# Patient Record
Sex: Female | Born: 1990 | Race: Black or African American | Hispanic: No | Marital: Married | Smoking: Never smoker
Health system: Southern US, Community
[De-identification: ages and names within clinical notes are randomized; demographics above are authoritative.]

## PROBLEM LIST (undated history)

## (undated) ENCOUNTER — Inpatient Hospital Stay (HOSPITAL_COMMUNITY): Payer: Self-pay

## (undated) DIAGNOSIS — Z789 Other specified health status: Secondary | ICD-10-CM

## (undated) HISTORY — PX: NO PAST SURGERIES: SHX2092

---

## 2015-11-01 NOTE — L&D Delivery Note (Signed)
Obstetrical Delivery Note   Date of Delivery:   08/10/2016 Primary OB:   [facility] Gestational Age/EDD: 1017w3d  Antepartum complications:  none  Delivered By:   Cornelia Copaharlie Donice Alperin, Jr MD  Delivery Type:   vacuum, low  Delivery Details:   CTSP by CNM due to recurrent deep variables and late decelerations with pushing. Fetus DOA and +3 with good descent with pushing. EFW 3000gm, pelvis felt adequate and given need to expedite delivery, pt was verbally consented for vacuum delivery, which she was amenable to, after d/w her re: r/b/a.  NICU team present. Kiwi vacuum applied in the usual fashion and patient pushed fetus to +3 to 4 with next UC and pop off x 1 at the end. On next UC and pull, fetus out and delivered easily in the standard fashion. Delayed cord clamping done and then handed to awaiting NICU team Anesthesia:    epidural Intrapartum complications: Chorio (patient received amp and gent x 1 prior to delivery) GBS:    Negative Laceration:    1st degree perineal (repaired) and right periuretheral repaired in the usual fashion with 2 and 3-0 plain gut, respectively Episiotomy:    none Placenta:    Delivered and expressed via active management. Intact: yes. To pathology: yes.  Delayed Cord Clamping: yes Estimated Blood Loss:  * No surgery found *  Baby:    Liveborn female, APGARs 8/9, weight 2950gm  Cornelia Copaharlie Marguarite Markov, Jr. MD Attending Center [facility](Faculty Practice)

## 2015-12-07 ENCOUNTER — Encounter: Payer: Self-pay | Admitting: General Practice

## 2015-12-07 ENCOUNTER — Ambulatory Visit (INDEPENDENT_AMBULATORY_CARE_PROVIDER_SITE_OTHER): Payer: 59 | Admitting: General Practice

## 2015-12-07 ENCOUNTER — Encounter: Payer: Self-pay | Admitting: Family Medicine

## 2015-12-07 DIAGNOSIS — Z3201 Encounter for pregnancy test, result positive: Secondary | ICD-10-CM | POA: Diagnosis not present

## 2015-12-07 LAB — POCT PREGNANCY, URINE: PREG TEST UR: POSITIVE — AB

## 2015-12-07 NOTE — Progress Notes (Signed)
Patient here today for upt. Upt +. Reports first positive home test yesterday 2/5. LMP 10/26/15. EDD 08/01/16.6 weeks today. Patient informed. New OB appt tomorrow 2/7 at [facility]. Letter given for preg verification & appt letter for job. Patient had no questions

## 2015-12-08 ENCOUNTER — Encounter: Payer: Self-pay | Admitting: Obstetrics

## 2015-12-08 ENCOUNTER — Ambulatory Visit (INDEPENDENT_AMBULATORY_CARE_PROVIDER_SITE_OTHER): Payer: 59 | Admitting: Obstetrics

## 2015-12-08 VITALS — BP 115/80 | HR 92 | Temp 98.3°F | Ht 65.5 in | Wt 124.0 lb

## 2015-12-08 DIAGNOSIS — N926 Irregular menstruation, unspecified: Secondary | ICD-10-CM | POA: Diagnosis not present

## 2015-12-08 DIAGNOSIS — Z3201 Encounter for pregnancy test, result positive: Secondary | ICD-10-CM | POA: Diagnosis not present

## 2015-12-08 DIAGNOSIS — Z3491 Encounter for supervision of normal pregnancy, unspecified, first trimester: Secondary | ICD-10-CM

## 2015-12-08 LAB — POCT URINE PREGNANCY: Preg Test, Ur: POSITIVE — AB

## 2015-12-08 MED ORDER — VITAFOL GUMMIES 3.33-0.333-34.8 MG PO CHEW: 1.0000 | CHEWABLE_TABLET | Freq: Every day | ORAL | Status: DC

## 2015-12-08 NOTE — Progress Notes (Signed)
Patient ID: Samantha Liu, female   DOB: 02-22-1991, 25 y.o.   MRN: [id number]  Chief Complaint  Patient presents with  . Amenorrhea    HPI Samantha Liu is a 25 y.o. female.  LMP 10-24-16.  Positive subjective signs of pregnancy.  Denies vaginal bleeding or significant cramping. HPI  History reviewed. No pertinent past medical history.  History reviewed. No pertinent past surgical history.  History reviewed. No pertinent family history.  Social History Social History  Substance Use Topics  . Smoking status: Never Smoker   . Smokeless tobacco: None  . Alcohol Use: No    No Known Allergies  Current Outpatient Prescriptions  Medication Sig Dispense Refill  . Prenatal Vit-Fe Phos-FA-Omega (VITAFOL GUMMIES) 3.33-0.333-34.8 MG CHEW Chew 1 tablet by mouth daily before breakfast. 90 tablet 3   No current facility-administered medications for this visit.    Review of Systems Review of Systems Constitutional: negative for fatigue and weight loss Respiratory: negative for cough and wheezing Cardiovascular: negative for chest pain, fatigue and palpitations Gastrointestinal: negative for abdominal pain and change in bowel habits Genitourinary: missed period.  Mild cramping, occasionally. Integument/breast: negative for nipple discharge Musculoskeletal:negative for myalgias Neurological: negative for gait problems and tremors Behavioral/Psych: negative for abusive relationship, depression Endocrine: negative for temperature intolerance     Blood pressure 115/80, pulse 92, temperature 98.3 F (36.8 C), height 5' 5.5" (1.664 m), weight 124 lb (56.246 kg), last menstrual period 10/25/2015.  Physical Exam Physical Exam General:   alert  Skin:   no rash or abnormalities  Lungs:   clear to auscultation bilaterally  Heart:   regular rate and rhythm, S1, S2 normal, no murmur, click, rub or gallop  Breasts:   normal without suspicious masses, skin or nipple changes or axillary nodes   Abdomen:  normal findings: no organomegaly, soft, non-tender and no hernia  Pelvis:  External genitalia: normal general appearance Urinary system: urethral meatus normal and bladder without fullness, nontender Vaginal: normal without tenderness, induration or masses Cervix: normal appearance Adnexa: normal bimanual exam Uterus: anteverted and non-tender, normal size      Data Reviewed UPT  Assessment     Early 1st trimester pregnancy.  Stable.     Plan    Wet prep and cultures done. PNV's Rx F/U in 4 weeks.  Orders Placed This Encounter  Procedures  . SureSwab, Vaginosis/Vaginitis Plus  . POCT urine pregnancy   Meds ordered this encounter  Medications  . Prenatal Vit-Fe Phos-FA-Omega (VITAFOL GUMMIES) 3.33-0.333-34.8 MG CHEW    Sig: Chew 1 tablet by mouth daily before breakfast.    Dispense:  90 tablet    Refill:  3

## 2015-12-11 ENCOUNTER — Other Ambulatory Visit: Payer: Self-pay | Admitting: Obstetrics

## 2015-12-11 DIAGNOSIS — N760 Acute vaginitis: Principal | ICD-10-CM

## 2015-12-11 DIAGNOSIS — B9689 Other specified bacterial agents as the cause of diseases classified elsewhere: Secondary | ICD-10-CM

## 2015-12-11 LAB — SURESWAB, VAGINOSIS/VAGINITIS PLUS
ATOPOBIUM VAGINAE: 5.9 Log (cells/mL)
C. PARAPSILOSIS, DNA: NOT DETECTED
C. TROPICALIS, DNA: NOT DETECTED
C. albicans, DNA: NOT DETECTED
C. glabrata, DNA: NOT DETECTED
C. trachomatis RNA, TMA: NOT DETECTED
Gardnerella vaginalis: 8 Log (cells/mL)
LACTOBACILLUS SPECIES: NOT DETECTED Log (cells/mL)
MEGASPHAERA SPECIES: 7.9 Log (cells/mL)
N. GONORRHOEAE RNA, TMA: NOT DETECTED
T. vaginalis RNA, QL TMA: NOT DETECTED

## 2015-12-11 MED ORDER — TINIDAZOLE 500 MG PO TABS: 1000.0000 mg | ORAL_TABLET | Freq: Every day | ORAL | Status: DC

## 2015-12-15 ENCOUNTER — Telehealth: Payer: Self-pay | Admitting: *Deleted

## 2015-12-15 NOTE — Telephone Encounter (Signed)
Pt called to office for return call.   Call placed to pt husband as number in chart was incorrect.  LM that office is returning her call.  Will try to call again tomorrow.

## 2015-12-16 ENCOUNTER — Telehealth: Payer: Self-pay | Admitting: *Deleted

## 2015-12-16 NOTE — Telephone Encounter (Signed)
Pt had placed call to office. Attempt to return call to pt.  No answer, LM on VM to call office.

## 2016-01-05 ENCOUNTER — Encounter: Payer: Self-pay | Admitting: Obstetrics

## 2016-01-05 ENCOUNTER — Ambulatory Visit (INDEPENDENT_AMBULATORY_CARE_PROVIDER_SITE_OTHER): Payer: 59 | Admitting: Obstetrics

## 2016-01-05 VITALS — BP 112/72 | HR 87 | Temp 98.3°F | Wt 126.0 lb

## 2016-01-05 DIAGNOSIS — Z3401 Encounter for supervision of normal first pregnancy, first trimester: Secondary | ICD-10-CM

## 2016-01-05 NOTE — Progress Notes (Signed)
Subjective:    Samantha Liu is being seen today for her first obstetrical visit.  This is not a planned pregnancy. She is at 3444w2d gestation. Her obstetrical history is significant for none. Relationship with FOB: significant other, living together. Patient does intend to breast feed. Pregnancy history fully reviewed.  The information documented in the HPI was reviewed and verified.  Menstrual History: OB History    Gravida Para Term Preterm AB TAB SAB Ectopic Multiple Living   1                Patient's last menstrual period was 10/25/2015.    History reviewed. No pertinent past medical history.  History reviewed. No pertinent past surgical history.   (Not in a hospital admission) No Known Allergies  Social History  Substance Use Topics  . Smoking status: Never Smoker   . Smokeless tobacco: Never Used  . Alcohol Use: No    History reviewed. No pertinent family history.   Review of Systems Constitutional: negative for weight loss Gastrointestinal: negative for vomiting Genitourinary:negative for genital lesions and vaginal discharge and dysuria Musculoskeletal:negative for back pain Behavioral/Psych: negative for abusive relationship, depression, illegal drug usage and tobacco use    Objective:    BP 112/72 mmHg  Pulse 87  Temp(Src) 98.3 F (36.8 C)  Wt 126 lb (57.153 kg)  LMP 10/25/2015 General Appearance:    Alert, cooperative, no distress, appears stated age  Head:    Normocephalic, without obvious abnormality, atraumatic  Eyes:    PERRL, conjunctiva/corneas clear, EOM's intact, fundi    benign, both eyes  Ears:    Normal TM's and external ear canals, both ears  Nose:   Nares normal, septum midline, mucosa normal, no drainage    or sinus tenderness  Throat:   Lips, mucosa, and tongue normal; teeth and gums normal  Neck:   Supple, symmetrical, trachea midline, no adenopathy;    thyroid:  no enlargement/tenderness/nodules; no carotid   bruit or JVD  Back:      Symmetric, no curvature, ROM normal, no CVA tenderness  Lungs:     Clear to auscultation bilaterally, respirations unlabored  Chest Wall:    No tenderness or deformity   Heart:    Regular rate and rhythm, S1 and S2 normal, no murmur, rub   or gallop  Breast Exam:    No tenderness, masses, or nipple abnormality  Abdomen:     Soft, non-tender, bowel sounds active all four quadrants,    no masses, no organomegaly  Genitalia:    Normal female without lesion, discharge or tenderness  Extremities:   Extremities normal, atraumatic, no cyanosis or edema  Pulses:   2+ and symmetric all extremities  Skin:   Skin color, texture, turgor normal, no rashes or lesions  Lymph nodes:   Cervical, supraclavicular, and axillary nodes normal  Neurologic:   CNII-XII intact, normal strength, sensation and reflexes    throughout      Lab Review Urine pregnancy test Labs reviewed yes Radiologic studies reviewed no Assessment:    Pregnancy at 4244w2d weeks    Plan:      Prenatal vitamins.  Counseling provided regarding continued use of seat belts, cessation of alcohol consumption, smoking or use of illicit drugs; infection precautions i.e., influenza/TDAP immunizations, toxoplasmosis,CMV, parvovirus, listeria and varicella; workplace safety, exercise during pregnancy; routine dental care, safe medications, sexual activity, hot tubs, saunas, pools, travel, caffeine use, fish and methlymercury, potential toxins, hair treatments, varicose veins Weight gain recommendations per IOM  guidelines reviewed: underweight/BMI< 18.5--> gain 28 - 40 lbs; normal weight/BMI 18.5 - 24.9--> gain 25 - 35 lbs; overweight/BMI 25 - 29.9--> gain 15 - 25 lbs; obese/BMI >30->gain  11 - 20 lbs Problem list reviewed and updated. FIRST/CF mutation testing/NIPT/QUAD SCREEN/fragile X/Ashkenazi Jewish population testing/Spinal muscular atrophy discussed: requested. Role of ultrasound in pregnancy discussed; fetal survey:  requested. Amniocentesis discussed: not indicated. VBAC calculator score: VBAC consent form provided No orders of the defined types were placed in this encounter.   Orders Placed This Encounter  Procedures  . Culture, OB Urine  . Obstetric panel  . HIV antibody  . Hemoglobinopathy evaluation  . Varicella zoster antibody, IgG  . VITAMIN D 25 Hydroxy (Vit-D Deficiency, Fractures)  . POCT urinalysis dipstick    Follow up in 4 weeks.

## 2016-01-06 LAB — OBSTETRIC PANEL
Antibody Screen: NEGATIVE
BASOS ABS: 0 10*3/uL (ref 0.0–0.1)
BASOS PCT: 0 % (ref 0–1)
Eosinophils Absolute: 0.1 10*3/uL (ref 0.0–0.7)
Eosinophils Relative: 1 % (ref 0–5)
HEMATOCRIT: 39.4 % (ref 36.0–46.0)
HEP B S AG: NEGATIVE
Hemoglobin: 13 g/dL (ref 12.0–15.0)
Lymphocytes Relative: 17 % (ref 12–46)
Lymphs Abs: 1.5 10*3/uL (ref 0.7–4.0)
MCH: 30 pg (ref 26.0–34.0)
MCHC: 33 g/dL (ref 30.0–36.0)
MCV: 91 fL (ref 78.0–100.0)
MONOS PCT: 10 % (ref 3–12)
MPV: 10.6 fL (ref 8.6–12.4)
Monocytes Absolute: 0.9 10*3/uL (ref 0.1–1.0)
NEUTROS ABS: 6.6 10*3/uL (ref 1.7–7.7)
NEUTROS PCT: 72 % (ref 43–77)
Platelets: 286 10*3/uL (ref 150–400)
RBC: 4.33 MIL/uL (ref 3.87–5.11)
RDW: 13.4 % (ref 11.5–15.5)
Rh Type: POSITIVE
Rubella: 2.5 Index — ABNORMAL HIGH (ref <0.90)
WBC: 9.1 10*3/uL (ref 4.0–10.5)

## 2016-01-06 LAB — VITAMIN D 25 HYDROXY (VIT D DEFICIENCY, FRACTURES): Vit D, 25-Hydroxy: 31 ng/mL (ref 30–100)

## 2016-01-06 LAB — HIV ANTIBODY (ROUTINE TESTING W REFLEX): HIV 1&2 Ab, 4th Generation: NONREACTIVE

## 2016-01-06 LAB — VARICELLA ZOSTER ANTIBODY, IGG: VARICELLA IGG: 291 {index} — AB (ref <135.00)

## 2016-01-07 LAB — HEMOGLOBINOPATHY EVALUATION
HEMOGLOBIN OTHER: 0 %
Hgb A2 Quant: 2.7 % (ref 2.2–3.2)
Hgb A: 96.7 % — ABNORMAL LOW (ref 96.8–97.8)
Hgb F Quant: 0.6 % (ref 0.0–2.0)
Hgb S Quant: 0 %

## 2016-01-08 LAB — CULTURE, OB URINE: Colony Count: 75000

## 2016-02-01 ENCOUNTER — Ambulatory Visit (INDEPENDENT_AMBULATORY_CARE_PROVIDER_SITE_OTHER): Payer: 59 | Admitting: Obstetrics

## 2016-02-01 ENCOUNTER — Encounter: Payer: Self-pay | Admitting: Obstetrics

## 2016-02-01 VITALS — BP 114/73 | HR 82 | Temp 98.9°F | Wt 132.0 lb

## 2016-02-01 DIAGNOSIS — Z3492 Encounter for supervision of normal pregnancy, unspecified, second trimester: Secondary | ICD-10-CM

## 2016-02-01 LAB — POCT URINALYSIS DIPSTICK
BILIRUBIN UA: NEGATIVE
Blood, UA: NEGATIVE
Glucose, UA: NEGATIVE
Nitrite, UA: NEGATIVE
PROTEIN UA: NEGATIVE
SPEC GRAV UA: 1.015
Urobilinogen, UA: NEGATIVE
pH, UA: 5

## 2016-02-01 NOTE — Progress Notes (Signed)
  Subjective:    Samantha Liu is a 25 y.o. female being seen today for her obstetrical visit. She is at 6998w1d gestation. Patient reports: no complaints.  Problem List Items Addressed This Visit    None    Visit Diagnoses    Prenatal care, second trimester    -  Primary    Relevant Orders    POCT urinalysis dipstick (Completed)      There are no active problems to display for this patient.   Objective:     BP 114/73 mmHg  Pulse 82  Temp(Src) 98.9 F (37.2 C)  Wt 132 lb (59.875 kg)  LMP 10/25/2015 Uterine Size: Below umbilicus     Assessment:    Pregnancy @ 198w1d  weeks Doing well    Plan:    Problem list reviewed and updated. Labs reviewed.  Follow up in 4 weeks. FIRST/CF mutation testing/NIPT/QUAD SCREEN/fragile X/Ashkenazi Jewish population testing/Spinal muscular atrophy discussed: declined. Role of ultrasound in pregnancy discussed; fetal survey: requested. Amniocentesis discussed: not indicated.

## 2016-02-01 NOTE — Progress Notes (Signed)
Patient is having some ligament pain on the L. Patient has questions about the Quad screen.

## 2016-02-02 ENCOUNTER — Encounter: Payer: 59 | Admitting: Obstetrics

## 2016-03-03 ENCOUNTER — Ambulatory Visit (INDEPENDENT_AMBULATORY_CARE_PROVIDER_SITE_OTHER): Payer: 59 | Admitting: Obstetrics

## 2016-03-03 ENCOUNTER — Encounter: Payer: Self-pay | Admitting: Obstetrics

## 2016-03-03 VITALS — BP 111/73 | HR 83

## 2016-03-03 DIAGNOSIS — Z3402 Encounter for supervision of normal first pregnancy, second trimester: Secondary | ICD-10-CM

## 2016-03-03 LAB — POCT URINALYSIS DIPSTICK
Bilirubin, UA: NEGATIVE
Blood, UA: NEGATIVE
Glucose, UA: NEGATIVE
KETONES UA: NEGATIVE
Nitrite, UA: NEGATIVE
PROTEIN UA: NEGATIVE
SPEC GRAV UA: 1.015
Urobilinogen, UA: NEGATIVE
pH, UA: 6

## 2016-03-03 NOTE — Progress Notes (Signed)
Patient has no questions or concerns today.

## 2016-03-03 NOTE — Progress Notes (Signed)
Subjective:    Samantha Liu is a [age] y.o. female being seen today for her obstetrical visit. She is at 3544w4d gestation. Patient reports: no complaints . Fetal movement: normal.  Problem List Items Addressed This Visit    None    Visit Diagnoses    Supervision of normal first pregnancy in second trimester    -  Primary    Relevant Orders    US OB Comp + 14 Wk    POCT urinalysis dipstick (Completed)    US MFM OB COMP + 14 WK      There are no active problems to display for this patient.  Objective:    BP 111/73 mmHg  Pulse 83  LMP 10/25/2015 FHT: 160 BPM  Uterine Size: size equals dates     Assessment:    Pregnancy @ 6444w4d    Plan:    Signs and symptoms of preterm labor: discussed.  Labs, problem list reviewed and updated 2 hr GTT planned Follow up in 4 weeks.

## 2016-03-07 ENCOUNTER — Other Ambulatory Visit: Payer: Self-pay | Admitting: *Deleted

## 2016-03-07 ENCOUNTER — Telehealth: Payer: Self-pay | Admitting: *Deleted

## 2016-03-07 DIAGNOSIS — Z3491 Encounter for supervision of normal pregnancy, unspecified, first trimester: Secondary | ICD-10-CM

## 2016-03-07 MED ORDER — VITAFOL GUMMIES 3.33-0.333-34.8 MG PO CHEW: 3.0000 | CHEWABLE_TABLET | Freq: Every day | ORAL | Status: DC

## 2016-03-07 NOTE — Telephone Encounter (Signed)
2:38 patient returned call 3:54 LM on VM to CB

## 2016-03-07 NOTE — Telephone Encounter (Signed)
Call to patient- patient is requesting a refill of her prenatal gummies. Rx sent to the pharmacy.

## 2016-03-07 NOTE — Telephone Encounter (Signed)
Patient request call back- no reason left

## 2016-03-09 ENCOUNTER — Other Ambulatory Visit: Payer: Self-pay | Admitting: Certified Nurse Midwife

## 2016-03-09 ENCOUNTER — Ambulatory Visit (HOSPITAL_COMMUNITY)
Admission: RE | Admit: 2016-03-09 | Discharge: 2016-03-09 | Disposition: A | Discharge status: Home / Self Care | Payer: 59 | Source: Ambulatory Visit | Attending: Certified Nurse Midwife | Admitting: Certified Nurse Midwife

## 2016-03-09 DIAGNOSIS — Z3689 Encounter for other specified antenatal screening: Secondary | ICD-10-CM

## 2016-03-09 DIAGNOSIS — Z3A19 19 weeks gestation of pregnancy: Secondary | ICD-10-CM | POA: Insufficient documentation

## 2016-03-09 DIAGNOSIS — Z3402 Encounter for supervision of normal first pregnancy, second trimester: Secondary | ICD-10-CM

## 2016-03-09 DIAGNOSIS — Z36 Encounter for antenatal screening of mother: Secondary | ICD-10-CM | POA: Diagnosis present

## 2016-03-17 ENCOUNTER — Telehealth: Payer: Self-pay | Admitting: *Deleted

## 2016-03-17 NOTE — Telephone Encounter (Signed)
Patient states Dr Clearance CootsHarper forgot to send medication for allergy and reflux. Told patient I would send him a message so that he would Rx it to her pharmacy.

## 2016-03-18 ENCOUNTER — Other Ambulatory Visit: Payer: Self-pay | Admitting: Obstetrics

## 2016-03-18 DIAGNOSIS — J301 Allergic rhinitis due to pollen: Secondary | ICD-10-CM

## 2016-03-18 DIAGNOSIS — K219 Gastro-esophageal reflux disease without esophagitis: Secondary | ICD-10-CM

## 2016-03-18 MED ORDER — LORATADINE 10 MG PO TABS: 10.0000 mg | ORAL_TABLET | Freq: Every day | ORAL | Status: DC

## 2016-03-18 MED ORDER — OMEPRAZOLE 20 MG PO CPDR: 20.0000 mg | DELAYED_RELEASE_CAPSULE | Freq: Two times a day (BID) | ORAL | Status: DC

## 2016-03-18 NOTE — Telephone Encounter (Signed)
Claritin Rx Prilosec Rx

## 2016-03-31 ENCOUNTER — Ambulatory Visit (INDEPENDENT_AMBULATORY_CARE_PROVIDER_SITE_OTHER): Payer: 59 | Admitting: Obstetrics

## 2016-03-31 VITALS — BP 111/78 | HR 80 | Wt 145.0 lb

## 2016-03-31 DIAGNOSIS — Z3402 Encounter for supervision of normal first pregnancy, second trimester: Secondary | ICD-10-CM

## 2016-03-31 DIAGNOSIS — K029 Dental caries, unspecified: Secondary | ICD-10-CM

## 2016-03-31 LAB — POCT URINALYSIS DIPSTICK
BILIRUBIN UA: NEGATIVE
Blood, UA: NEGATIVE
GLUCOSE UA: NEGATIVE
Ketones, UA: NEGATIVE
NITRITE UA: NEGATIVE
Protein, UA: NEGATIVE
Spec Grav, UA: 1.01
Urobilinogen, UA: NEGATIVE
pH, UA: 7

## 2016-03-31 MED ORDER — CLINDAMYCIN HCL 300 MG PO CAPS: 300.0000 mg | ORAL_CAPSULE | Freq: Three times a day (TID) | ORAL | Status: DC

## 2016-03-31 MED ORDER — PRENATE MINI 18-0.6-0.4-350 MG PO CAPS: 1.0000 | ORAL_CAPSULE | Freq: Every day | ORAL | Status: DC

## 2016-03-31 MED ORDER — OXYCODONE-ACETAMINOPHEN 5-325 MG PO TABS: 1.0000 | ORAL_TABLET | ORAL | Status: DC | PRN

## 2016-04-02 ENCOUNTER — Encounter: Payer: Self-pay | Admitting: Obstetrics

## 2016-04-02 NOTE — Progress Notes (Signed)
Subjective:    Tobey GrimShaquan Wright is a 25 y.o. female being seen today for her obstetrical visit. She is at 5023w6d gestation. Patient reports: toothache / cavity, possible abscess. Fetal movement: normal.  Problem List Items Addressed This Visit    None    Visit Diagnoses    Encounter for supervision of normal first pregnancy in second trimester    -  Primary    Relevant Medications    Prenat-FeCbn-FeAsp-Meth-FA-DHA (PRENATE MINI) 18-0.6-0.4-350 MG CAPS    Other Relevant Orders    POCT urinalysis dipstick (Completed)    Dental caries into pulp        Relevant Medications    oxyCODONE-acetaminophen (ROXICET) 5-325 MG tablet    clindamycin (CLEOCIN) 300 MG capsule      There are no active problems to display for this patient.  Objective:    BP 111/78 mmHg  Pulse 80  Wt 145 lb (65.772 kg)  LMP 10/25/2015 FHT: 150 BPM  Uterine Size: size equals dates     Assessment:    Pregnancy @ 2923w6d     Dental caries, possible abscess  Plan:     Clindamycin and Percocet Rx for toothache / possible abscess.  Proper dental health in pregnancy discussed.  Referred to Dentist   Signs and symptoms of preterm labor: discussed.  Labs, problem list reviewed and updated 2 hr GTT planned Follow up in 4 weeks.

## 2016-04-15 ENCOUNTER — Other Ambulatory Visit: Payer: Self-pay | Admitting: Certified Nurse Midwife

## 2016-04-28 ENCOUNTER — Encounter: Payer: Self-pay | Admitting: Obstetrics

## 2016-04-28 ENCOUNTER — Ambulatory Visit (INDEPENDENT_AMBULATORY_CARE_PROVIDER_SITE_OTHER): Payer: 59 | Admitting: Obstetrics

## 2016-04-28 ENCOUNTER — Encounter: Payer: 59 | Admitting: Obstetrics

## 2016-04-28 VITALS — BP 114/75 | HR 82 | Wt 154.0 lb

## 2016-04-28 DIAGNOSIS — Z3402 Encounter for supervision of normal first pregnancy, second trimester: Secondary | ICD-10-CM

## 2016-04-28 LAB — POCT URINALYSIS DIPSTICK
Bilirubin, UA: NEGATIVE
Glucose, UA: NEGATIVE
KETONES UA: NEGATIVE
Leukocytes, UA: NEGATIVE
Nitrite, UA: NEGATIVE
PH UA: 7
PROTEIN UA: NEGATIVE
RBC UA: NEGATIVE
SPEC GRAV UA: 1.015
UROBILINOGEN UA: NEGATIVE

## 2016-04-28 NOTE — Progress Notes (Signed)
Subjective:    Samantha Liu is a 25 y.o. female being seen today for her obstetrical visit. She is at 2937w4d gestation. Patient reports: no complaints . Fetal movement: normal.  Problem List Items Addressed This Visit    None    Visit Diagnoses    Encounter for supervision of normal first pregnancy in second trimester    -  Primary    Relevant Orders    POCT urinalysis dipstick (Completed)      There are no active problems to display for this patient.  Objective:    BP 114/75 mmHg  Pulse 82  Wt 154 lb (69.854 kg)  LMP 10/25/2015 FHT: 160 BPM  Uterine Size: size equals dates     Assessment:    Pregnancy @ 6237w4d    Plan:    OBGCT: ordered for next visit.  Labs, problem list reviewed and updated 2 hr GTT planned Follow up in 2 weeks.

## 2016-05-12 ENCOUNTER — Ambulatory Visit (INDEPENDENT_AMBULATORY_CARE_PROVIDER_SITE_OTHER): Payer: Medicaid Other | Admitting: Obstetrics

## 2016-05-12 ENCOUNTER — Other Ambulatory Visit: Payer: Medicaid Other

## 2016-05-12 ENCOUNTER — Encounter: Payer: Self-pay | Admitting: Obstetrics

## 2016-05-12 VITALS — BP 122/83 | HR 85 | Temp 98.1°F | Wt 155.6 lb

## 2016-05-12 DIAGNOSIS — Z3403 Encounter for supervision of normal first pregnancy, third trimester: Secondary | ICD-10-CM

## 2016-05-12 LAB — POCT URINALYSIS DIPSTICK
Bilirubin, UA: NEGATIVE
GLUCOSE UA: NEGATIVE
Ketones, UA: NEGATIVE
LEUKOCYTES UA: NEGATIVE
NITRITE UA: NEGATIVE
PH UA: 7
Protein, UA: NEGATIVE
RBC UA: NEGATIVE
Spec Grav, UA: <=1.005
UROBILINOGEN UA: NEGATIVE

## 2016-05-12 NOTE — Progress Notes (Signed)
Subjective:    Samantha Liu is a 25 y.o. female being seen today for her obstetrical visit. She is at 4236w4d gestation. Patient reports no complaints. Fetal movement: normal.  Problem List Items Addressed This Visit    None    Visit Diagnoses    Encounter for supervision of normal first pregnancy in third trimester    -  Primary    Relevant Orders    Glucose Tolerance, 2 Hours w/1 Hour    CBC    HIV antibody    RPR    POCT urinalysis dipstick (Completed)      There are no active problems to display for this patient.  Objective:    BP 122/83 mmHg  Pulse 85  Temp(Src) 98.1 F (36.7 C)  Wt 155 lb 9.6 oz (70.58 kg)  LMP 10/25/2015 FHT:  160 BPM  Uterine Size: size equals dates  Presentation: unsure     Assessment:    Pregnancy @ 5936w4d weeks   Plan:     labs reviewed, problem list updated Consent signed. GBS sent TDAP offered  Rhogam given for RH negative Pediatrician: discussed. Infant feeding: plans to breastfeed. Maternity leave: discussed. Cigarette smoking: never smoked. Orders Placed This Encounter  Procedures  . Glucose Tolerance, 2 Hours w/1 Hour  . CBC  . HIV antibody  . RPR  . POCT urinalysis dipstick   No orders of the defined types were placed in this encounter.   Follow up in 2 Weeks.

## 2016-05-12 NOTE — Progress Notes (Signed)
Patient reports she is doing well 

## 2016-05-13 LAB — CBC
Hematocrit: 35.6 % (ref 34.0–46.6)
Hemoglobin: 12 g/dL (ref 11.1–15.9)
MCH: 30.9 pg (ref 26.6–33.0)
MCHC: 33.7 g/dL (ref 31.5–35.7)
MCV: 92 fL (ref 79–97)
PLATELETS: 283 10*3/uL (ref 150–379)
RBC: 3.88 x10E6/uL (ref 3.77–5.28)
RDW: 13.9 % (ref 12.3–15.4)
WBC: 11.3 10*3/uL — ABNORMAL HIGH (ref 3.4–10.8)

## 2016-05-13 LAB — RPR: RPR Ser Ql: NONREACTIVE

## 2016-05-13 LAB — GLUCOSE TOLERANCE, 2 HOURS W/ 1HR
GLUCOSE, FASTING: 70 mg/dL (ref 65–91)
Glucose, 1 hour: 78 mg/dL (ref 65–179)
Glucose, 2 hour: 75 mg/dL (ref 65–152)

## 2016-05-13 LAB — HIV ANTIBODY (ROUTINE TESTING W REFLEX): HIV Screen 4th Generation wRfx: NONREACTIVE

## 2016-05-26 ENCOUNTER — Ambulatory Visit (INDEPENDENT_AMBULATORY_CARE_PROVIDER_SITE_OTHER): Payer: Medicaid Other | Admitting: Obstetrics

## 2016-05-26 VITALS — BP 122/78 | HR 93 | Temp 98.6°F | Wt 157.6 lb

## 2016-05-26 DIAGNOSIS — Z3403 Encounter for supervision of normal first pregnancy, third trimester: Secondary | ICD-10-CM

## 2016-05-26 DIAGNOSIS — Z3493 Encounter for supervision of normal pregnancy, unspecified, third trimester: Secondary | ICD-10-CM

## 2016-05-26 NOTE — Progress Notes (Signed)
Patient has no concerns today.Patient ID: Samantha Liu, female   DOB: [birth year], 25 y.o.   MRN: [id number] Subjective:    Samantha Liu is a 25 y.o. female being seen today for her obstetrical visit. She is at [redacted]w[redacted]d gestation. Patient reports no complaints. Fetal movement: normal.  Problem List Items Addressed This Visit    None    Visit Diagnoses    Encounter for supervision of normal first pregnancy in third trimester    -  Primary   Relevant Orders   POCT urinalysis dipstick     There are no active problems to display for this patient.  Objective:    BP 122/78   Pulse 93   Temp 98.6 F (37 C)   Wt 157 lb 9.6 oz (71.5 kg)   LMP 10/25/2015   BMI 25.83 kg/m  FHT:  150 BPM  Uterine Size: size equals dates  Presentation: unsure     Assessment:    Pregnancy @ [redacted]w[redacted]d weeks   Plan:     labs reviewed, problem list updated Consent signed. GBS sent TDAP offered  Rhogam given for RH negative Pediatrician: discussed. Infant feeding: plans to breastfeed. Maternity leave: discussed. Cigarette smoking: never smoked.  Orders Placed This Encounter  Procedures  . POCT urinalysis dipstick   No orders of the defined types were placed in this encounter.  Follow up in 1 Week.

## 2016-05-30 ENCOUNTER — Encounter: Payer: Self-pay | Admitting: Obstetrics

## 2016-06-06 ENCOUNTER — Ambulatory Visit (INDEPENDENT_AMBULATORY_CARE_PROVIDER_SITE_OTHER): Payer: Medicaid Other | Admitting: Obstetrics

## 2016-06-06 ENCOUNTER — Encounter: Payer: Self-pay | Admitting: Obstetrics

## 2016-06-06 VITALS — BP 117/75 | HR 96 | Wt 159.0 lb

## 2016-06-06 DIAGNOSIS — Z3493 Encounter for supervision of normal pregnancy, unspecified, third trimester: Secondary | ICD-10-CM

## 2016-06-06 NOTE — Progress Notes (Signed)
Patient ID: Samantha Liu, female   DOB: 05/31/1991, 25 y.o.   MRN: [id number] Subjective:    Samantha Liu is a 25 y.o. female being seen today for her obstetrical visit. She is at 5141w1d gestation. Patient reports no complaints. Fetal movement: normal.  Problem List Items Addressed This Visit    None    Visit Diagnoses   None.    There are no active problems to display for this patient.  Objective:    BP 117/75   Pulse 96   Wt 159 lb (72.1 kg)   LMP 10/25/2015   BMI 26.06 kg/m  FHT:  150 BPM  Uterine Size: size equals dates  Presentation: unsure     Assessment:    Pregnancy @ 641w1d weeks   Plan:     labs reviewed, problem list updated Consent signed. GBS sent TDAP offered  Rhogam given for RH negative Pediatrician: discussed. Infant feeding: plans to breastfeed. Maternity leave: discussed.  No orders of the defined types were placed in this encounter.  No orders of the defined types were placed in this encounter.  Follow up in 2 Weeks.

## 2016-06-20 ENCOUNTER — Encounter: Payer: Self-pay | Admitting: Obstetrics

## 2016-06-20 ENCOUNTER — Ambulatory Visit (INDEPENDENT_AMBULATORY_CARE_PROVIDER_SITE_OTHER): Payer: Medicaid Other | Admitting: Obstetrics

## 2016-06-20 VITALS — BP 111/75 | HR 81 | Temp 99.0°F | Wt 161.6 lb

## 2016-06-20 DIAGNOSIS — Z3403 Encounter for supervision of normal first pregnancy, third trimester: Secondary | ICD-10-CM

## 2016-06-20 DIAGNOSIS — K219 Gastro-esophageal reflux disease without esophagitis: Secondary | ICD-10-CM

## 2016-06-20 DIAGNOSIS — K047 Periapical abscess without sinus: Secondary | ICD-10-CM

## 2016-06-20 LAB — POCT URINALYSIS DIPSTICK
Bilirubin, UA: NEGATIVE
Blood, UA: NEGATIVE
Glucose, UA: NEGATIVE
KETONES UA: NEGATIVE
LEUKOCYTES UA: NEGATIVE
Nitrite, UA: NEGATIVE
PH UA: 6
PROTEIN UA: NEGATIVE
Spec Grav, UA: 1.015
UROBILINOGEN UA: 0.2

## 2016-06-20 MED ORDER — CLINDAMYCIN HCL 300 MG PO CAPS
300.0000 mg | ORAL_CAPSULE | Freq: Three times a day (TID) | ORAL | 1 refills | Status: DC
Start: 2016-06-20 — End: 2016-07-20

## 2016-06-20 MED ORDER — OMEPRAZOLE 20 MG PO CPDR: 20.0000 mg | DELAYED_RELEASE_CAPSULE | Freq: Two times a day (BID) | ORAL | 5 refills | Status: DC

## 2016-06-20 NOTE — Progress Notes (Signed)
Subjective:    Samantha Liu is a 25 y.o. female being seen today for her obstetrical visit. She is at 6276w1d gestation. Patient reports heartburn, occasional contractions and toothache. Fetal movement: normal.  Problem List Items Addressed This Visit    None    Visit Diagnoses    Encounter for supervision of normal first pregnancy in third trimester    -  Primary   Relevant Orders   POCT Urinalysis Dipstick     There are no active problems to display for this patient.  Objective:    BP 111/75   Pulse 81   Temp 99 F (37.2 C)   Wt 161 lb 9.6 oz (73.3 kg)   LMP 10/25/2015   BMI 26.48 kg/m  FHT:  150 BPM  Uterine Size: size equals dates  Presentation: unsure     Assessment:    Pregnancy @ 4576w1d weeks    Toothache  Heartburn   Plan:    Clindamycin and oxycodone Rx.  Patient referred to dentist.  Omeprazole Rx   labs reviewed, problem list updated Consent signed. GBS sent TDAP offered  Rhogam given for RH negative Pediatrician: discussed. Infant feeding: plans to breastfeed. Maternity leave: discussed. Cigarette smoking: never smoked.  Orders Placed This Encounter  Procedures  . POCT Urinalysis Dipstick   No orders of the defined types were placed in this encounter.  Follow up in 1 Week.

## 2016-06-20 NOTE — Progress Notes (Signed)
Pt denies concerns at this time. 

## 2016-06-22 ENCOUNTER — Ambulatory Visit (INDEPENDENT_AMBULATORY_CARE_PROVIDER_SITE_OTHER): Payer: Medicaid Other | Admitting: Certified Nurse Midwife

## 2016-06-22 ENCOUNTER — Encounter: Payer: Self-pay | Admitting: Certified Nurse Midwife

## 2016-06-22 ENCOUNTER — Encounter: Payer: Self-pay | Admitting: *Deleted

## 2016-06-22 VITALS — BP 112/76 | HR 73

## 2016-06-22 DIAGNOSIS — Z3403 Encounter for supervision of normal first pregnancy, third trimester: Secondary | ICD-10-CM

## 2016-06-22 DIAGNOSIS — O26843 Uterine size-date discrepancy, third trimester: Secondary | ICD-10-CM

## 2016-06-22 LAB — POCT URINALYSIS DIPSTICK
BILIRUBIN UA: NEGATIVE
Blood, UA: NEGATIVE
Ketones, UA: NEGATIVE
Leukocytes, UA: NEGATIVE
NITRITE UA: NEGATIVE
Protein, UA: NEGATIVE
Spec Grav, UA: <=1.005
UROBILINOGEN UA: NEGATIVE
pH, UA: 6.5

## 2016-06-22 NOTE — Progress Notes (Signed)
Subjective:    Samantha Liu is a 25 y.o. female being seen today for her obstetrical visit. She is at 6116w3d gestation. Patient reports no bleeding, no contractions, no cramping, no leaking and states that she had an anxiety attack this morning at her place of employment, had a hot feelling stood up and passed out.  Did not fall on abdomen, passed out in chair.  EMS was called.  Nomotensive.  Stated that she vomited and then felt better.  here for exam with spouse. Fetal movement: normal.  Problem List Items Addressed This Visit    None    Visit Diagnoses    Supervision of normal first pregnancy in third trimester    -  Primary   Relevant Orders   US MFM OB FOLLOW UP   Uterine size date discrepancy, antepartum condition, third trimester       Relevant Orders   US MFM OB FOLLOW UP     There are no active problems to display for this patient.  Objective:    BP 112/76   Pulse 73   LMP 10/25/2015  FHT:  127 BPM  Uterine Size: 30 cm and size less than dates  Presentation: cephalic     Assessment:    Pregnancy @ 4516w3d weeks   Syncope episode in pregnancy   S<D Plan:     labs reviewed, problem list updated Consent signed. GBS planning TDAP offered  Rhogam given for RH negative Pediatrician: discussed. Infant feeding: plans to breastfeed, plans to bottle feed. Maternity leave: discussed. Cigarette smoking: never smoked. Orders Placed This Encounter  Procedures  . US MFM OB FOLLOW UP    Standing Status:   Future    Standing Expiration Date:   08/22/2017    Order Specific Question:   Reason for Exam (SYMPTOM  OR DIAGNOSIS REQUIRED)    Answer:   growth, S<D    Order Specific Question:   Preferred imaging location?    Answer:   [facility]Ultras[facility]   No orders of the defined types were placed in this encounter.  Follow up in 2 Weeks with GBS.

## 2016-06-27 ENCOUNTER — Encounter: Payer: Self-pay | Admitting: Obstetrics

## 2016-06-27 ENCOUNTER — Ambulatory Visit (INDEPENDENT_AMBULATORY_CARE_PROVIDER_SITE_OTHER): Payer: Medicaid Other | Admitting: Obstetrics

## 2016-06-27 VITALS — BP 121/77 | HR 84 | Wt 162.0 lb

## 2016-06-27 DIAGNOSIS — Z3403 Encounter for supervision of normal first pregnancy, third trimester: Secondary | ICD-10-CM

## 2016-06-27 DIAGNOSIS — Z3493 Encounter for supervision of normal pregnancy, unspecified, third trimester: Secondary | ICD-10-CM

## 2016-06-27 LAB — POCT URINALYSIS DIPSTICK
Bilirubin, UA: NEGATIVE
Glucose, UA: NEGATIVE
Ketones, UA: NEGATIVE
Nitrite, UA: NEGATIVE
PROTEIN UA: NEGATIVE
RBC UA: NEGATIVE
UROBILINOGEN UA: NEGATIVE
pH, UA: 8

## 2016-06-27 NOTE — Progress Notes (Signed)
Patient ID: Samantha Liu, female   DOB: 07/05/1991, 25 y.o.   MRN: [id number] Subjective:    Samantha Liu is a 25 y.o. female being seen today for her obstetrical visit. She is at 7177w1d gestation. Patient reports no complaints. Fetal movement: normal.  Problem List Items Addressed This Visit    None    Visit Diagnoses    Encounter for supervision of normal first pregnancy in third trimester    -  Primary   Relevant Orders   POCT urinalysis dipstick (Completed)   Strep Gp B NAA     Patient Active Problem List   Diagnosis Date Noted  . Supervision of normal first pregnancy in third trimester 06/22/2016   Objective:    BP 121/77   Pulse 84   Wt 162 lb (73.5 kg)   LMP 10/25/2015   BMI 26.55 kg/m  FHT:  150 BPM  Uterine Size: size equals dates  Presentation: unsure     Assessment:    Pregnancy @ 577w1d weeks   Plan:     labs reviewed, problem list updated Consent signed. GBS sent TDAP offered  Rhogam given for RH negative Pediatrician: discussed. Infant feeding: plans to breastfeed. Maternity leave: discussed. Cigarette smoking: never smoked. Orders Placed This Encounter  Procedures  . Strep Gp B NAA  . POCT urinalysis dipstick   No orders of the defined types were placed in this encounter.  Follow up in 1 Week.

## 2016-06-27 NOTE — Progress Notes (Signed)
Patient reports she is doing well. Needs GBS today.

## 2016-06-28 LAB — OB RESULTS CONSOLE GBS: GBS: NEGATIVE

## 2016-06-29 LAB — STREP GP B NAA: STREP GROUP B AG: NEGATIVE

## 2016-07-01 ENCOUNTER — Ambulatory Visit (HOSPITAL_COMMUNITY)
Admission: RE | Admit: 2016-07-01 | Discharge: 2016-07-01 | Disposition: A | Discharge status: Home / Self Care | Payer: 59 | Source: Ambulatory Visit | Attending: Certified Nurse Midwife | Admitting: Certified Nurse Midwife

## 2016-07-01 DIAGNOSIS — O26843 Uterine size-date discrepancy, third trimester: Secondary | ICD-10-CM | POA: Diagnosis not present

## 2016-07-01 DIAGNOSIS — Z3A35 35 weeks gestation of pregnancy: Secondary | ICD-10-CM | POA: Diagnosis not present

## 2016-07-01 DIAGNOSIS — Z3403 Encounter for supervision of normal first pregnancy, third trimester: Secondary | ICD-10-CM

## 2016-07-05 ENCOUNTER — Ambulatory Visit (INDEPENDENT_AMBULATORY_CARE_PROVIDER_SITE_OTHER): Payer: Medicaid Other | Admitting: Obstetrics

## 2016-07-05 VITALS — BP 105/71 | HR 84 | Temp 98.4°F | Wt 168.7 lb

## 2016-07-05 DIAGNOSIS — Z3403 Encounter for supervision of normal first pregnancy, third trimester: Secondary | ICD-10-CM

## 2016-07-05 LAB — POCT URINALYSIS DIPSTICK
Bilirubin, UA: NEGATIVE
Blood, UA: NEGATIVE
GLUCOSE UA: NEGATIVE
Ketones, UA: NEGATIVE
Leukocytes, UA: NEGATIVE
NITRITE UA: NEGATIVE
SPEC GRAV UA: 1.015
UROBILINOGEN UA: NEGATIVE
pH, UA: 7

## 2016-07-05 NOTE — Progress Notes (Signed)
Pt. Has no questions or concerns at this time. °

## 2016-07-06 DIAGNOSIS — Z029 Encounter for administrative examinations, unspecified: Secondary | ICD-10-CM

## 2016-07-07 ENCOUNTER — Encounter: Payer: Self-pay | Admitting: Obstetrics

## 2016-07-11 ENCOUNTER — Ambulatory Visit (INDEPENDENT_AMBULATORY_CARE_PROVIDER_SITE_OTHER): Payer: Medicaid Other | Admitting: Obstetrics

## 2016-07-11 ENCOUNTER — Encounter: Payer: Self-pay | Admitting: Obstetrics

## 2016-07-11 VITALS — BP 124/78 | HR 93 | Temp 98.0°F | Wt 168.0 lb

## 2016-07-11 DIAGNOSIS — Z3403 Encounter for supervision of normal first pregnancy, third trimester: Secondary | ICD-10-CM

## 2016-07-11 NOTE — Progress Notes (Signed)
Subjective:    Samantha ShelterShaquan Liu is a 25 y.o. female being seen today for her obstetrical visit. She is at 4423w1d gestation. Patient reports no complaints. Fetal movement: normal.  Problem List Items Addressed This Visit    None    Visit Diagnoses   None.    Patient Active Problem List   Diagnosis Date Noted  . Supervision of normal first pregnancy in third trimester 06/22/2016    Objective:    BP 124/78   Pulse 93   Temp 98 F (36.7 C)   Wt 168 lb (76.2 kg)   LMP 10/25/2015   BMI 27.53 kg/m  FHT: 150 BPM  Uterine Size: size equals dates  Presentations: cephalic   Assessment:    Pregnancy @ 8023w1d weeks   Plan:   Plans for delivery: Vaginal anticipated; labs reviewed; problem list updated Counseling: Consent signed. Infant feeding: plans to breastfeed. Cigarette smoking: never smoked. L&D discussion: symptoms of labor, discussed when to call, discussed what number to call, anesthetic/analgesic options reviewed and delivering clinician:  plans no preference. Postpartum supports and preparation: circumcision discussed and contraception plans discussed.  Follow up in 1 Week.

## 2016-07-20 ENCOUNTER — Encounter: Payer: Self-pay | Admitting: Obstetrics

## 2016-07-20 ENCOUNTER — Ambulatory Visit (INDEPENDENT_AMBULATORY_CARE_PROVIDER_SITE_OTHER): Payer: Medicaid Other | Admitting: Obstetrics

## 2016-07-20 VITALS — BP 122/82 | HR 99 | Temp 98.9°F | Wt 168.6 lb

## 2016-07-20 DIAGNOSIS — Z3493 Encounter for supervision of normal pregnancy, unspecified, third trimester: Secondary | ICD-10-CM

## 2016-07-20 DIAGNOSIS — Z3403 Encounter for supervision of normal first pregnancy, third trimester: Secondary | ICD-10-CM

## 2016-07-20 NOTE — Progress Notes (Signed)
Patient has an increase in discharge- thick. Patient has had increased swelling in hand and feet.

## 2016-07-20 NOTE — Progress Notes (Signed)
Subjective:    Corine ShelterShaquan Woulfe is a 25 y.o. female being seen today for her obstetrical visit. She is at 4344w3d gestation. Patient reports no complaints. Fetal movement: normal.  Problem List Items Addressed This Visit    None    Visit Diagnoses   None.    Patient Active Problem List   Diagnosis Date Noted  . Supervision of normal first pregnancy in third trimester 06/22/2016    Objective:    BP 122/82   Pulse 99   Temp 98.9 F (37.2 C)   Wt 168 lb 9.6 oz (76.5 kg)   LMP 10/25/2015   BMI 27.63 kg/m  FHT: 150 BPM  Uterine Size: size equals dates    Assessment:    Pregnancy @ 8444w3d weeks   Plan:   Plans for delivery: Vaginal anticipated; labs reviewed; problem list updated Counseling: Consent signed. Infant feeding: plans to breastfeed. Cigarette smoking: never smoked. L&D discussion: symptoms of labor, discussed when to call, discussed what number to call, anesthetic/analgesic options reviewed and delivering clinician:  plans no preference. Postpartum supports and preparation: circumcision discussed and contraception plans discussed.  Follow up in 1 Week.  Patient ID: Corine ShelterShaquan Luper, female   DOB: [birth year], 25 y.o.   MRN: [id number]

## 2016-07-27 ENCOUNTER — Encounter: Payer: Self-pay | Admitting: Obstetrics

## 2016-07-27 ENCOUNTER — Ambulatory Visit (INDEPENDENT_AMBULATORY_CARE_PROVIDER_SITE_OTHER): Payer: Medicaid Other | Admitting: Obstetrics

## 2016-07-27 VITALS — BP 125/91 | HR 90

## 2016-07-27 DIAGNOSIS — Z3493 Encounter for supervision of normal pregnancy, unspecified, third trimester: Secondary | ICD-10-CM

## 2016-07-27 NOTE — Progress Notes (Signed)
Subjective:    Samantha Liu is a 25 y.o. female being seen today for her obstetrical visit. She is at 6768w3d gestation. Patient reports occasional contractions. Fetal movement: normal.  Problem List Items Addressed This Visit    None    Visit Diagnoses   None.    Patient Active Problem List   Diagnosis Date Noted  . Supervision of normal first pregnancy in third trimester 06/22/2016    Objective:    BP (!) 125/91   Pulse 90   LMP 10/25/2015  FHT: 150 BPM  Uterine Size: size equals dates  Presentations: cephalic  Pelvic Exam:              Dilation: 1cm       Effacement: 75%             Station:  -3    Consistency: medium            Position: posterior     Assessment:    Pregnancy @ 10168w3d weeks   Plan:   Plans for delivery: Vaginal anticipated; labs reviewed; problem list updated Counseling: Consent signed. Infant feeding: plans to breastfeed. Cigarette smoking: never smoked. L&D discussion: symptoms of labor, discussed when to call, discussed what number to call, anesthetic/analgesic options reviewed and delivering clinician:  plans no preference. Postpartum supports and preparation: circumcision discussed and contraception plans discussed.  Follow up in 1 Week.  Patient ID: Samantha Liu, female   DOB: 05/13/1991, 25 y.o.   MRN: [id number]

## 2016-08-03 ENCOUNTER — Ambulatory Visit (INDEPENDENT_AMBULATORY_CARE_PROVIDER_SITE_OTHER): Payer: Medicaid Other | Admitting: Obstetrics & Gynecology

## 2016-08-03 ENCOUNTER — Inpatient Hospital Stay (HOSPITAL_COMMUNITY)
Admission: AD | Admit: 2016-08-03 | Discharge: 2016-08-03 | Disposition: A | Discharge status: Home / Self Care | Payer: Medicaid Other | Source: Ambulatory Visit | Attending: Obstetrics and Gynecology | Admitting: Obstetrics and Gynecology

## 2016-08-03 ENCOUNTER — Inpatient Hospital Stay (HOSPITAL_COMMUNITY): Payer: Medicaid Other

## 2016-08-03 ENCOUNTER — Encounter (HOSPITAL_COMMUNITY): Payer: Self-pay | Admitting: *Deleted

## 2016-08-03 ENCOUNTER — Telehealth (HOSPITAL_COMMUNITY): Payer: Self-pay | Admitting: *Deleted

## 2016-08-03 VITALS — BP 114/83 | HR 73 | Temp 98.7°F | Wt 171.8 lb

## 2016-08-03 DIAGNOSIS — Z3403 Encounter for supervision of normal first pregnancy, third trimester: Secondary | ICD-10-CM | POA: Diagnosis not present

## 2016-08-03 DIAGNOSIS — O288 Other abnormal findings on antenatal screening of mother: Secondary | ICD-10-CM

## 2016-08-03 DIAGNOSIS — Z3A40 40 weeks gestation of pregnancy: Secondary | ICD-10-CM | POA: Insufficient documentation

## 2016-08-03 DIAGNOSIS — Z3493 Encounter for supervision of normal pregnancy, unspecified, third trimester: Secondary | ICD-10-CM | POA: Diagnosis present

## 2016-08-03 DIAGNOSIS — O26853 Spotting complicating pregnancy, third trimester: Secondary | ICD-10-CM

## 2016-08-03 DIAGNOSIS — O289 Unspecified abnormal findings on antenatal screening of mother: Secondary | ICD-10-CM

## 2016-08-03 NOTE — Discharge Instructions (Signed)
Braxton Hicks Contractions °Contractions of the uterus can occur throughout pregnancy. Contractions are not always a sign that you are in labor.  °WHAT ARE BRAXTON HICKS CONTRACTIONS?  °Contractions that occur before labor are called Braxton Hicks contractions, or false labor. Toward the end of pregnancy (32-34 weeks), these contractions can develop more often and may become more forceful. This is not true labor because these contractions do not result in opening (dilatation) and thinning of the cervix. They are sometimes difficult to tell apart from true labor because these contractions can be forceful and people have different pain tolerances. You should not feel embarrassed if you go to the hospital with false labor. Sometimes, the only way to tell if you are in true labor is for your health care provider to look for changes in the cervix. °If there are no prenatal problems or other health problems associated with the pregnancy, it is completely safe to be sent home with false labor and await the onset of true labor. °HOW CAN YOU TELL THE DIFFERENCE BETWEEN TRUE AND FALSE LABOR? °False Labor °· The contractions of false labor are usually shorter and not as hard as those of true labor.   °· The contractions are usually irregular.   °· The contractions are often felt in the front of the lower abdomen and in the groin.   °· The contractions may go away when you walk around or change positions while lying down.   °· The contractions get weaker and are shorter lasting as time goes on.   °· The contractions do not usually become progressively stronger, regular, and closer together as with true labor.   °True Labor °· Contractions in true labor last 30-70 seconds, become very regular, usually become more intense, and increase in frequency.   °· The contractions do not go away with walking.   °· The discomfort is usually felt in the top of the uterus and spreads to the lower abdomen and low back.   °· True labor can be  determined by your health care provider with an exam. This will show that the cervix is dilating and getting thinner.   °WHAT TO REMEMBER °· Keep up with your usual exercises and follow other instructions given by your health care provider.   °· Take medicines as directed by your health care provider.   °· Keep your regular prenatal appointments.   °· Eat and drink lightly if you think you are going into labor.   °· If Braxton Hicks contractions are making you uncomfortable:   °¨ Change your position from lying down or resting to walking, or from walking to resting.   °¨ Sit and rest in a tub of warm water.   °¨ Drink 2-3 glasses of water. Dehydration may cause these contractions.   °¨ Do slow and deep breathing several times an hour.   °WHEN SHOULD I SEEK IMMEDIATE MEDICAL CARE? °Seek immediate medical care if: °· Your contractions become stronger, more regular, and closer together.   °· You have fluid leaking or gushing from your vagina.   °· You have a fever.   °· You pass blood-tinged mucus.   °· You have vaginal bleeding.   °· You have continuous abdominal pain.   °· You have low back pain that you never had before.   °· You feel your baby's head pushing down and causing pelvic pressure.   °· Your baby is not moving as much as it used to.   °  °This information is not intended to replace advice given to you by your health care provider. Make sure you discuss any questions you have with your health care   provider. °  °Document Released: 10/17/2005 Document Revised: 10/22/2013 Document Reviewed: 07/29/2013 °Elsevier Interactive Patient Education ©2016 [employer] ° °

## 2016-08-03 NOTE — Progress Notes (Signed)
   PRENATAL VISIT NOTE  Subjective:  Samantha Liu is a 25 y.o. G1P0 at 969w3d being seen today for ongoing prenatal care.  She is currently monitored for the following issues for this low-risk pregnancy and has Supervision of normal first pregnancy in third trimester on her problem list.  Patient reports occasional contractions and mucus d/c.  Contractions: Not present. Vag. Bleeding: None.  Movement: Present. Denies leaking of fluid.   The following portions of the patient's history were reviewed and updated as appropriate: allergies, current medications, past family history, past medical history, past social history, past surgical history and problem list. Problem list updated.  Objective:   Vitals:   08/03/16 0913  BP: 114/83  Pulse: 73  Temp: 98.7 F (37.1 C)  Weight: 171 lb 12.8 oz (77.9 kg)    Fetal Status: Fetal Heart Rate (bpm): 130   Movement: Present     General:  Alert, oriented and cooperative. Patient is in no acute distress.  Skin: Skin is warm and dry. No rash noted.   Cardiovascular: Normal heart rate noted  Respiratory: Normal respiratory effort, no problems with respiration noted  Abdomen: Soft, gravid, appropriate for gestational age. Pain/Pressure: Present     Pelvic:  Cervical exam performed        Extremities: Normal range of motion.  Edema: Trace  Mental Status: Normal mood and affect. Normal behavior. Normal judgment and thought content.   Urinalysis:      Assessment and Plan:  Pregnancy: G1P0 at 4969w3d  1. Supervision of normal first pregnancy in third trimester Approaching postdates, schedule IOL 41 weeks, fetal monitoring today, AFI  Term labor symptoms and general obstetric precautions including but not limited to vaginal bleeding, contractions, leaking of fluid and fetal movement were reviewed in detail with the patient. Please refer to After Visit Summary for other counseling recommendations.  Return in about 6 weeks (around 09/14/2016), or  postpartm.  Adam PhenixJames G Shaheen Mende, MD

## 2016-08-03 NOTE — Telephone Encounter (Signed)
Preadmission screen  

## 2016-08-03 NOTE — Progress Notes (Signed)
Patient is in office and states that she feels good.

## 2016-08-03 NOTE — MAU Provider Note (Signed)
History     CSN: [id number]  Arrival date and time: 08/03/16 2102  Chief Complaint  Patient presents with  . Labor Eval   HPI  Samantha Liu is a 25 year old G1P0 at [redacted]w[redacted]d presenting to the MAU for labor evaluation. She had her membranes stripped in clinic this afternoon and she noticed some spotting when she got home. The spotting stopped and she started having "mucosy" discharge. She then took a nap and when she woke up, she noticed more spotting. She called the nurse at her Ob/gyn's office, who recommended that she come into the MAU for labor evaluation. While in the MAU, her baby had a 3 minute deceleration on monitoring. She has been having occasional, mild contractions. She denies any true vaginal bleeding besides the spotting as described above. She denies leakage of fluids. She endorses good fetal movement. She has not had any complications this pregnancy.  No past medical history on file.  Past Surgical History:  Procedure Laterality Date  . NO PAST SURGERIES      No family history on file.  Social History  Substance Use Topics  . Smoking status: Never Smoker  . Smokeless tobacco: Never Used  . Alcohol use No    Allergies: No Known Allergies  Prescriptions Prior to Admission  Medication Sig Dispense Refill Last Dose  . acetaminophen (TYLENOL) 500 MG tablet Take 1,000 mg by mouth every 6 (six) hours as needed for moderate pain.   Past Week at Unknown time  . omeprazole (PRILOSEC) 20 MG capsule Take 1 capsule (20 mg total) by mouth 2 (two) times daily before a meal. 60 capsule 5 Past Week at Unknown time  . Prenat-FeCbn-FeAsp-Meth-FA-DHA (PRENATE MINI) 18-0.6-0.4-350 MG CAPS Take 1 capsule by mouth daily before breakfast. 90 capsule 4 08/02/2016 at Unknown time  . loratadine (CLARITIN) 10 MG tablet Take 1 tablet (10 mg total) by mouth daily. (Patient not taking: Reported on 08/03/2016) 30 tablet 11 Not Taking    Review of Systems  Constitutional: Negative for fever.  Eyes:  Negative for blurred vision.  Respiratory: Negative for cough.   Cardiovascular: Negative for chest pain.  Gastrointestinal: Negative for nausea and vomiting.  Genitourinary: Negative for dysuria.  Skin: Negative for rash.  Neurological: Negative for headaches.  Endo/Heme/Allergies: Negative for environmental allergies.   Physical Exam   Blood pressure 131/84, pulse 88, temperature 98.5 F (36.9 C), temperature source Oral, resp. rate 19, height 5' 5.5" (1.664 m), weight 78.5 kg (173 lb), last menstrual period 10/25/2015, SpO2 98 %.  Physical Exam  Nursing note and vitals reviewed. Constitutional: She is oriented to person, place, and time. She appears well-developed and well-nourished. No distress.  Eyes: No scleral icterus.  Neck: Normal range of motion.  Cardiovascular: Normal rate.   Respiratory: Effort normal.  GI: Soft. There is no tenderness.  gravid  Musculoskeletal: Normal range of motion.  Neurological: She is alert and oriented to person, place, and time.  Skin: Skin is warm and dry. No rash noted.    MAU Course  Procedures  MDM Samantha Liu is a 25 year old G1P0 at [redacted]w[redacted]d presenting for labor eval. While in the MAU, baby had a 3 minute deceleration on the monitor. Baby was otherwise reactive with a normal strip. Pt is having small, irregular uterine contractions and some uterine irritability. No vaginal bleeding except for spotting after having her membranes stripped. BPP performed to evaluate further. BPP was 8/8. She is safe for discharge home.  Assessment and Plan  1.  Labor evaluation- Cervix was 2-3/70/-2, which was unchanged from her cervical check in the office earlier today. - Discussed signs of labor - Continue routine prenatal care  2. Deceleration- Baby had one 3 minute deceleration. Otherwise reactive. - BPP performed and was 8/8 - Continue routine prenatal care  Hilton SinclairKaty D Mayo 08/03/2016, 10:30 PM   CNM attestation:  I have seen and examined this patient;  I agree with above documentation in the resident's note.   Samantha Liu is a 25 y.o. G1P0 reporting spotting after having membranes swept in office +FM, denies LOF, contractions  PE: BP 124/79 (BP Location: Right Arm)   Pulse 86   Temp 98.3 F (36.8 C) (Oral)   Resp 16   Ht 5' 5.5" (1.664 m)   Wt 78.5 kg (173 lb)   LMP 10/25/2015   SpO2 98%   BMI 28.35 kg/m  Gen: calm comfortable, NAD Resp: normal effort, no distress Abd: gravid  ROS, labs, PMH reviewed NST: 120s, +accels, one variable to nadir of 100-110 with good variability throughout- occurred when pt was briefly flat on her back- resumed spont to 120s with immediate Cat 1  BPP: 8/8, AFI 10cm  Plan: - fetal kick counts reinforced,  labor precautions - has BPP at 10a 10/5; cancelled this; [facility] of plan for pt to keep her IOL as scheduled for 10/10, and for the [facility] office to call pt if she needs to be seen before then; pt and spouse agreeable w/ plan  Cam HaiSHAW, [name], CNM 12:14 AM

## 2016-08-03 NOTE — MAU Note (Signed)
Pt reports spotting after having membranes stripped today in office. Was told to come in by on call RN because "she shouldn't still be having spotting." Contractions every 10 mins. Rates 2/10. Denies LOF. +FM. 2cm today.

## 2016-08-04 ENCOUNTER — Ambulatory Visit (HOSPITAL_COMMUNITY): Payer: Medicaid Other

## 2016-08-04 ENCOUNTER — Other Ambulatory Visit: Payer: Self-pay | Admitting: Certified Nurse Midwife

## 2016-08-05 ENCOUNTER — Encounter: Payer: Medicaid Other | Admitting: Obstetrics

## 2016-08-08 ENCOUNTER — Encounter: Payer: Self-pay | Admitting: Certified Nurse Midwife

## 2016-08-09 ENCOUNTER — Inpatient Hospital Stay (HOSPITAL_COMMUNITY): Payer: Medicaid Other | Admitting: Anesthesiology

## 2016-08-09 ENCOUNTER — Encounter (HOSPITAL_COMMUNITY): Payer: Self-pay

## 2016-08-09 ENCOUNTER — Inpatient Hospital Stay (HOSPITAL_COMMUNITY)
Admission: RE | Admit: 2016-08-09 | Discharge: 2016-08-11 | DRG: 775 | Disposition: A | Discharge status: Home / Self Care | Payer: Medicaid Other | Source: Ambulatory Visit | Attending: Obstetrics and Gynecology | Admitting: Obstetrics and Gynecology

## 2016-08-09 DIAGNOSIS — O480 Post-term pregnancy: Secondary | ICD-10-CM | POA: Diagnosis present

## 2016-08-09 DIAGNOSIS — O411230 Chorioamnionitis, third trimester, not applicable or unspecified: Secondary | ICD-10-CM | POA: Diagnosis present

## 2016-08-09 DIAGNOSIS — Z3A41 41 weeks gestation of pregnancy: Secondary | ICD-10-CM

## 2016-08-09 LAB — CBC
HEMATOCRIT: 36.6 % (ref 36.0–46.0)
Hemoglobin: 12.8 g/dL (ref 12.0–15.0)
MCH: 31.1 pg (ref 26.0–34.0)
MCHC: 35 g/dL (ref 30.0–36.0)
MCV: 88.8 fL (ref 78.0–100.0)
Platelets: 227 10*3/uL (ref 150–400)
RBC: 4.12 MIL/uL (ref 3.87–5.11)
RDW: 13 % (ref 11.5–15.5)
WBC: 12 10*3/uL — AB (ref 4.0–10.5)

## 2016-08-09 LAB — ABO/RH: ABO/RH(D): B POS

## 2016-08-09 LAB — TYPE AND SCREEN
ABO/RH(D): B POS
Antibody Screen: NEGATIVE

## 2016-08-09 LAB — RPR: RPR: NONREACTIVE

## 2016-08-09 MED ORDER — OXYTOCIN BOLUS FROM INFUSION: 500.0000 mL | Freq: Once | INTRAVENOUS | Status: DC

## 2016-08-09 MED ORDER — ACETAMINOPHEN 325 MG PO TABS: 650.0000 mg | ORAL_TABLET | ORAL | Status: DC | PRN

## 2016-08-09 MED ORDER — FENTANYL CITRATE (PF) 100 MCG/2ML IJ SOLN
INTRAMUSCULAR | Status: AC
Start: 2016-08-09 — End: 2016-08-10
  Filled 2016-08-09: qty 2

## 2016-08-09 MED ORDER — LACTATED RINGERS IV SOLN: 500.0000 mL | INTRAVENOUS | Status: DC | PRN

## 2016-08-09 MED ORDER — SOD CITRATE-CITRIC ACID 500-334 MG/5ML PO SOLN
30.0000 mL | ORAL | Status: DC | PRN
  Filled 2016-08-09: qty 15

## 2016-08-09 MED ORDER — LIDOCAINE HCL (PF) 1 % IJ SOLN
30.0000 mL | INTRAMUSCULAR | Status: DC | PRN
Start: 2016-08-09 — End: 2016-08-10
  Filled 2016-08-09: qty 30

## 2016-08-09 MED ORDER — OXYCODONE-ACETAMINOPHEN 5-325 MG PO TABS: 1.0000 | ORAL_TABLET | ORAL | Status: DC | PRN

## 2016-08-09 MED ORDER — LACTATED RINGERS IV SOLN
500.0000 mL | Freq: Once | INTRAVENOUS | Status: AC
  Administered 2016-08-09: 500 mL via INTRAVENOUS

## 2016-08-09 MED ORDER — ONDANSETRON HCL 4 MG/2ML IJ SOLN: 4.0000 mg | Freq: Four times a day (QID) | INTRAMUSCULAR | Status: DC | PRN

## 2016-08-09 MED ORDER — OXYCODONE-ACETAMINOPHEN 5-325 MG PO TABS: 2.0000 | ORAL_TABLET | ORAL | Status: DC | PRN

## 2016-08-09 MED ORDER — PHENYLEPHRINE 40 MCG/ML (10ML) SYRINGE FOR IV PUSH (FOR BLOOD PRESSURE SUPPORT)
80.0000 ug | PREFILLED_SYRINGE | INTRAVENOUS | Status: DC | PRN
  Filled 2016-08-09: qty 5
  Filled 2016-08-09: qty 10

## 2016-08-09 MED ORDER — FENTANYL 2.5 MCG/ML BUPIVACAINE 1/10 % EPIDURAL INFUSION (WH - ANES)
14.0000 mL/h | INTRAMUSCULAR | Status: DC | PRN
  Administered 2016-08-09 – 2016-08-10 (×2): 14 mL/h via EPIDURAL
  Filled 2016-08-09 (×2): qty 125

## 2016-08-09 MED ORDER — EPHEDRINE 5 MG/ML INJ
10.0000 mg | INTRAVENOUS | Status: DC | PRN
  Filled 2016-08-09: qty 4

## 2016-08-09 MED ORDER — TERBUTALINE SULFATE 1 MG/ML IJ SOLN
0.2500 mg | Freq: Once | INTRAMUSCULAR | Status: DC | PRN
  Filled 2016-08-09: qty 1

## 2016-08-09 MED ORDER — LIDOCAINE HCL (PF) 1 % IJ SOLN
INTRAMUSCULAR | Status: DC | PRN
  Administered 2016-08-09 (×2): 7 mL via EPIDURAL

## 2016-08-09 MED ORDER — FLEET ENEMA 7-19 GM/118ML RE ENEM: 1.0000 | ENEMA | RECTAL | Status: DC | PRN

## 2016-08-09 MED ORDER — MISOPROSTOL 25 MCG QUARTER TABLET
25.0000 ug | ORAL_TABLET | ORAL | Status: DC | PRN
  Administered 2016-08-09: 25 ug via VAGINAL
  Filled 2016-08-09: qty 1
  Filled 2016-08-09: qty 0.25

## 2016-08-09 MED ORDER — DIPHENHYDRAMINE HCL 50 MG/ML IJ SOLN: 12.5000 mg | INTRAMUSCULAR | Status: DC | PRN

## 2016-08-09 MED ORDER — OXYTOCIN 40 UNITS IN LACTATED RINGERS INFUSION - SIMPLE MED: 1.0000 m[IU]/min | INTRAVENOUS | Status: DC

## 2016-08-09 MED ORDER — OXYTOCIN 40 UNITS IN LACTATED RINGERS INFUSION - SIMPLE MED: 2.5000 [IU]/h | INTRAVENOUS | Status: DC

## 2016-08-09 MED ORDER — LACTATED RINGERS IV SOLN
INTRAVENOUS | Status: DC
Start: 2016-08-09 — End: 2016-08-10
  Administered 2016-08-09: 09:00:00 via INTRAVENOUS

## 2016-08-09 MED ORDER — FENTANYL CITRATE (PF) 100 MCG/2ML IJ SOLN
100.0000 ug | INTRAMUSCULAR | Status: DC | PRN
  Administered 2016-08-09: 100 ug via INTRAVENOUS

## 2016-08-09 MED ORDER — OXYTOCIN 40 UNITS IN LACTATED RINGERS INFUSION - SIMPLE MED
1.0000 m[IU]/min | INTRAVENOUS | Status: DC
  Administered 2016-08-09: 2 m[IU]/min via INTRAVENOUS
  Filled 2016-08-09: qty 1000

## 2016-08-09 NOTE — Progress Notes (Signed)
Patient resting in bed comfortable with epidural. Reports some leaking of fluid. Pitocin stopped as of 2 pm today. FHR reactive with accelerations. Anticipate NSVD.

## 2016-08-09 NOTE — Progress Notes (Signed)
LABOR PROGRESS NOTE  Corine ShelterShaquan Zapf is a 25 y.o. G1P0 at 4062w2d  admitted for IOL for post dates.  Subjective: Doing well. Comfortable with epidural. Has been off Pit since 1400 for variables/lates. Contracting on her own.  Objective: BP 130/77   Pulse 89   Temp 97.6 F (36.4 C) (Oral)   Resp 18   Ht 5' 5.5" (1.664 m)   Wt 78.5 kg (173 lb)   LMP 10/25/2015   BMI 28.35 kg/m  or  Vitals:   08/09/16 1901 08/09/16 1931 08/09/16 2001 08/09/16 2031  BP: (!) 112/95 117/89 117/69 130/77  Pulse: 99 87 72 89  Resp: 16 18 18 18   Temp: 97.6 F (36.4 C)     TempSrc: Oral     Weight:      Height:        Dilation: 6.5 Effacement (%): 90 Station: 0, +1 Presentation: Vertex Exam by:: Cletis MediaK. Anderson, RN  Labs: Lab Results  Component Value Date   WBC 12.0 (H) 08/09/2016   HGB 12.8 08/09/2016   HCT 36.6 08/09/2016   MCV 88.8 08/09/2016   PLT 227 08/09/2016    Patient Active Problem List   Diagnosis Date Noted  . Post-dates pregnancy 08/09/2016  . Supervision of normal first pregnancy in third trimester 06/22/2016    Assessment / Plan: 25 y.o. G1P0 at 162w2d here for IOL for post dates.  Labor: s/p Foley. Off Pit since 1400 for variables/lates. Now contracting well on her own. Fetal Wellbeing:  Category I Pain Control:  Epidural Anticipated MOD:  SVD  Hilton SinclairKaty D Kathi Dohn, MD 08/09/2016, 8:37 PM

## 2016-08-09 NOTE — Progress Notes (Signed)
Patient seen and evaluated with Dr. Cyril LoosenSchnek and Dr. Wonda Oldsiccio. Patient is resting comfortably; pitocin turned off at 1330 due to FHR. Discussed risks and benefits of FB placement with patient; she agrees to placement. FB placed successful with 60 cc of fluid. Balloon in place. Luna KitchensKathryn Jonai Weyland CNM

## 2016-08-09 NOTE — Anesthesia Preprocedure Evaluation (Signed)
Anesthesia Evaluation  Patient identified by MRN, date of birth, ID band Patient awake    Reviewed: Allergy & Precautions, H&P , NPO status , Patient's Chart, lab work & pertinent test results  Airway Mallampati: I  TM Distance: >3 FB Neck ROM: full    Dental no notable dental hx.    Pulmonary neg pulmonary ROS,    Pulmonary exam normal breath sounds clear to auscultation       Cardiovascular negative cardio ROS Normal cardiovascular exam     Neuro/Psych negative neurological ROS  negative psych ROS   GI/Hepatic negative GI ROS, Neg liver ROS,   Endo/Other  negative endocrine ROS  Renal/GU negative Renal ROS     Musculoskeletal   Abdominal Normal abdominal exam  (+)   Peds  Hematology negative hematology ROS (+)   Anesthesia Other Findings   Reproductive/Obstetrics (+) Pregnancy                             Anesthesia Physical Anesthesia Plan  ASA: II  Anesthesia Plan: Epidural   Post-op Pain Management:    Induction:   Airway Management Planned:   Additional Equipment:   Intra-op Plan:   Post-operative Plan:   Informed Consent: I have reviewed the patients History and Physical, chart, labs and discussed the procedure including the risks, benefits and alternatives for the proposed anesthesia with the patient or authorized representative who has indicated his/her understanding and acceptance.     Plan Discussed with:   Anesthesia Plan Comments:         Anesthesia Quick Evaluation  

## 2016-08-09 NOTE — Progress Notes (Signed)
LABOR PROGRESS NOTE  Samantha ShelterShaquan Liu is a 25 y.o. G1P0 at 7269w2d  admitted for IOL for post dates.  Subjective: Doing well. Pain well-controlled. No concerns.  Objective: BP 123/72   Pulse 89   Temp 99.7 F (37.6 C) (Axillary)   Resp 18   Ht 5' 5.5" (1.664 m)   Wt 78.5 kg (173 lb)   LMP 10/25/2015   BMI 28.35 kg/m  or  Vitals:   08/09/16 2031 08/09/16 2101 08/09/16 2131 08/09/16 2201  BP: 130/77 125/79 (!) 106/54 123/72  Pulse: 89 88 90 89  Resp: 18 16 18    Temp:  99.7 F (37.6 C)    TempSrc:  Axillary    Weight:      Height:        Dilation: 6.5 Effacement (%): 90 Station: 0, +1 Presentation: Vertex Exam by:: Cletis MediaK. Anderson, RN  Labs: Lab Results  Component Value Date   WBC 12.0 (H) 08/09/2016   HGB 12.8 08/09/2016   HCT 36.6 08/09/2016   MCV 88.8 08/09/2016   PLT 227 08/09/2016    Patient Active Problem List   Diagnosis Date Noted  . Post-dates pregnancy 08/09/2016  . Supervision of normal first pregnancy in third trimester 06/22/2016    Assessment / Plan: 25 y.o. G1P0 at 5369w2d here for IOL for post dates.  Labor: Has not made much progress since last check. Still 6-7. IUPC placed. Will monitor contractions and start Pit if not adequate. Fetal Wellbeing:  Category I Pain Control:  Epidural Anticipated MOD:  SVD  Hilton SinclairKaty D Nixxon Faria, MD 08/09/2016, 10:15 PM

## 2016-08-09 NOTE — H&P (Signed)
LABOR AND DELIVERY ADMISSION HISTORY AND PHYSICAL NOTE  Samantha Liu is a 25 y.o. female G1P0 with IUP at [redacted]w[redacted]d by LMP presenting for PDIOL.   She reports positive fetal movement. She denies leakage of fluid or vaginal bleeding. Felt some mild contractions last night but nothing since. No gross rupture of membranes.   Prenatal History/Complications:  Past Medical History: No past medical history on file.  Past Surgical History: Past Surgical History:  Procedure Laterality Date  . NO PAST SURGERIES      Obstetrical History: OB History    Gravida Para Term Preterm AB Living   1             SAB TAB Ectopic Multiple Live Births                  Social History: Social History   Social History  . Marital status: Married    Spouse name: N/A  . Number of children: N/A  . Years of education: N/A   Social History Main Topics  . Smoking status: Never Smoker  . Smokeless tobacco: Never Used  . Alcohol use No  . Drug use: No  . Sexual activity: Yes    Partners: Male    Birth control/ protection: None   Other Topics Concern  . Not on file   Social History Narrative  . No narrative on file    Family History: No family history on file.  Allergies: No Known Allergies  Prescriptions Prior to Admission  Medication Sig Dispense Refill Last Dose  . acetaminophen (TYLENOL) 500 MG tablet Take 1,000 mg by mouth every 6 (six) hours as needed for moderate pain.   Past Week at Unknown time  . omeprazole (PRILOSEC) 20 MG capsule Take 1 capsule (20 mg total) by mouth 2 (two) times daily before a meal. 60 capsule 5 Past Week at Unknown time  . Prenat-FeCbn-FeAsp-Meth-FA-DHA (PRENATE MINI) 18-0.6-0.4-350 MG CAPS Take 1 capsule by mouth daily before breakfast. 90 capsule 4 08/02/2016 at Unknown time     Review of Systems   All systems reviewed and negative except as stated in HPI  Last menstrual period 10/25/2015. General appearance: alert, cooperative and no distress Lungs:  no respiratory distress Heart: regular rate Abdomen: soft, non-tender Extremities: No calf swelling or tenderness Presentation: cephalic by US Fetal monitoring: baseline 135, good variability, no decelerations Uterine activity: none   Prenatal labs: ABO, Rh: B/POS/-- (03/07 1420) Antibody: NEG (03/07 1420) Rubella: immune RPR: Non Reactive (07/13 1115)  HBsAg: NEGATIVE (03/07 1420)  HIV: Non Reactive (07/13 1115)  GBS: Negative (08/28 1351)   Prenatal Transfer Tool  Maternal Diabetes: No Genetic Screening: Normal Maternal Ultrasounds/Referrals: Normal Fetal Ultrasounds or other Referrals:  Referred to Materal Fetal Medicine  Maternal Substance Abuse:  No Significant Maternal Medications:  None Significant Maternal Lab Results: None  No results found for this or any previous visit (from the past 24 hour(s)).  Patient Active Problem List   Diagnosis Date Noted  . Post-dates pregnancy 08/09/2016  . Supervision of normal first pregnancy in third trimester 06/22/2016    Assessment: Samantha Liu is a 25 y.o. G1P0 at [redacted]w[redacted]d here for PDIOL  #Labor: IOL #Pain: Wants epidural  #FWB: Category 1 tracing #ID:  GBS neg #MOF: breast #MOC:depo #Circ:  n/a  Tillman Sers, DO PGY-1 10/10/20177:23 AM   OB FELLOW HISTORY AND PHYSICAL ATTESTATION  I have seen and examined this patient; I agree with above documentation in the resident's note.  Ernestina Pennaicholas Ricky Gallery 08/09/2016, 12:13 PM

## 2016-08-09 NOTE — Anesthesia Pain Management Evaluation Note (Signed)
  CRNA Pain Management Visit Note  Patient: Samantha Liu, 25 y.o., female  "Hello [facility]. We have an anesthesia team available at all times to provide care throughout the hospital, including epidural management and anesthesia for C-section. I don't know your plan for the delivery whether it a natural birth, water birth, IV sedation, nitrous supplementation, doula or epidural, but we want to meet your pain goals."   1.Was your pain managed to your expectations on prior hospitalizations?   No prior hospitalizations  2.What is your expectation for pain management during this hospitalization?     Epidural  3.How can we help you reach that goal?epidural  Record the patient's initial score and the patient's pain goal.   Pain: 0  Pain Goal: 6 [facility] wants you to be able to say your pain was always managed very well.  Samantha Liu 08/09/2016

## 2016-08-09 NOTE — Anesthesia Procedure Notes (Signed)
Epidural Patient location during procedure: OB Start time: 08/09/2016 4:11 PM End time: 08/09/2016 4:15 PM  Staffing Anesthesiologist: Leilani AbleHATCHETT, Micala Saltsman Performed: anesthesiologist   Preanesthetic Checklist Completed: patient identified, surgical consent, pre-op evaluation, timeout performed, IV checked, risks and benefits discussed and monitors and equipment checked  Epidural Patient position: sitting Prep: site prepped and draped and DuraPrep Patient monitoring: continuous pulse ox and blood pressure Approach: midline Location: L3-L4 Injection technique: LOR air  Needle:  Needle type: Tuohy  Needle gauge: 17 G Needle length: 9 cm and 9 Needle insertion depth: 6 cm Catheter type: closed end flexible Catheter size: 19 Gauge Catheter at skin depth: 11 cm Test dose: negative and Other  Assessment Sensory level: T9 Events: blood not aspirated, injection not painful, no injection resistance, negative IV test and no paresthesia  Additional Notes Reason for block:procedure for pain

## 2016-08-10 ENCOUNTER — Encounter (HOSPITAL_COMMUNITY): Payer: Self-pay

## 2016-08-10 DIAGNOSIS — O480 Post-term pregnancy: Secondary | ICD-10-CM

## 2016-08-10 DIAGNOSIS — Z3A41 41 weeks gestation of pregnancy: Secondary | ICD-10-CM

## 2016-08-10 DIAGNOSIS — O411230 Chorioamnionitis, third trimester, not applicable or unspecified: Secondary | ICD-10-CM

## 2016-08-10 LAB — CBC
HCT: 32.3 % — ABNORMAL LOW (ref 36.0–46.0)
HEMOGLOBIN: 11.2 g/dL — AB (ref 12.0–15.0)
MCH: 30.8 pg (ref 26.0–34.0)
MCHC: 34.7 g/dL (ref 30.0–36.0)
MCV: 88.7 fL (ref 78.0–100.0)
PLATELETS: 200 10*3/uL (ref 150–400)
RBC: 3.64 MIL/uL — ABNORMAL LOW (ref 3.87–5.11)
RDW: 13.1 % (ref 11.5–15.5)
WBC: 21.6 10*3/uL — ABNORMAL HIGH (ref 4.0–10.5)

## 2016-08-10 MED ORDER — TETANUS-DIPHTH-ACELL PERTUSSIS 5-2.5-18.5 LF-MCG/0.5 IM SUSP
0.5000 mL | Freq: Once | INTRAMUSCULAR | Status: AC
  Administered 2016-08-11: 0.5 mL via INTRAMUSCULAR
  Filled 2016-08-10: qty 0.5

## 2016-08-10 MED ORDER — OXYTOCIN 40 UNITS IN LACTATED RINGERS INFUSION - SIMPLE MED: 2.5000 [IU]/h | INTRAVENOUS | Status: DC | PRN

## 2016-08-10 MED ORDER — DIPHENHYDRAMINE HCL 25 MG PO CAPS: 25.0000 mg | ORAL_CAPSULE | Freq: Four times a day (QID) | ORAL | Status: DC | PRN

## 2016-08-10 MED ORDER — ACETAMINOPHEN 325 MG PO TABS: 650.0000 mg | ORAL_TABLET | ORAL | Status: DC | PRN

## 2016-08-10 MED ORDER — MAGNESIUM HYDROXIDE 400 MG/5ML PO SUSP: 30.0000 mL | ORAL | Status: DC | PRN

## 2016-08-10 MED ORDER — DEXTROSE 5 % IV SOLN
170.0000 mg | Freq: Three times a day (TID) | INTRAVENOUS | Status: DC
  Administered 2016-08-10: 170 mg via INTRAVENOUS
  Filled 2016-08-10 (×2): qty 4.25

## 2016-08-10 MED ORDER — ONDANSETRON HCL 4 MG/2ML IJ SOLN: 4.0000 mg | INTRAMUSCULAR | Status: DC | PRN

## 2016-08-10 MED ORDER — DIBUCAINE 1 % RE OINT
1.0000 | TOPICAL_OINTMENT | RECTAL | Status: DC | PRN
Start: 2016-08-10 — End: 2016-08-11

## 2016-08-10 MED ORDER — SIMETHICONE 80 MG PO CHEW: 80.0000 mg | CHEWABLE_TABLET | ORAL | Status: DC | PRN

## 2016-08-10 MED ORDER — ONDANSETRON HCL 4 MG PO TABS: 4.0000 mg | ORAL_TABLET | ORAL | Status: DC | PRN

## 2016-08-10 MED ORDER — ACETAMINOPHEN 500 MG PO TABS
1000.0000 mg | ORAL_TABLET | Freq: Four times a day (QID) | ORAL | Status: DC | PRN
  Administered 2016-08-10: 1000 mg via ORAL
  Filled 2016-08-10: qty 2

## 2016-08-10 MED ORDER — LACTATED RINGERS IV SOLN
INTRAVENOUS | Status: DC
Start: 2016-08-10 — End: 2016-08-10
  Administered 2016-08-10: 02:00:00 via INTRAUTERINE

## 2016-08-10 MED ORDER — OXYCODONE HCL 5 MG PO TABS: 5.0000 mg | ORAL_TABLET | ORAL | Status: DC | PRN

## 2016-08-10 MED ORDER — SENNOSIDES-DOCUSATE SODIUM 8.6-50 MG PO TABS: 1.0000 | ORAL_TABLET | Freq: Every evening | ORAL | Status: DC | PRN

## 2016-08-10 MED ORDER — AMPICILLIN SODIUM 2 G IJ SOLR
2.0000 g | Freq: Four times a day (QID) | INTRAMUSCULAR | Status: DC
  Administered 2016-08-10: 2 g via INTRAVENOUS
  Filled 2016-08-10 (×3): qty 2000

## 2016-08-10 MED ORDER — IBUPROFEN 600 MG PO TABS
600.0000 mg | ORAL_TABLET | Freq: Four times a day (QID) | ORAL | Status: DC
  Administered 2016-08-10 – 2016-08-11 (×6): 600 mg via ORAL
  Filled 2016-08-10 (×6): qty 1

## 2016-08-10 MED ORDER — BENZOCAINE-MENTHOL 20-0.5 % EX AERO
1.0000 "application " | INHALATION_SPRAY | CUTANEOUS | Status: DC | PRN
  Administered 2016-08-10: 1 via TOPICAL
  Filled 2016-08-10: qty 56

## 2016-08-10 MED ORDER — COCONUT OIL OIL: 1.0000 "application " | TOPICAL_OIL | Status: DC | PRN

## 2016-08-10 MED ORDER — PRENATAL MULTIVITAMIN CH
1.0000 | ORAL_TABLET | Freq: Every day | ORAL | Status: DC
  Administered 2016-08-10 – 2016-08-11 (×2): 1 via ORAL
  Filled 2016-08-10 (×2): qty 1

## 2016-08-10 MED ORDER — WITCH HAZEL-GLYCERIN EX PADS: 1.0000 "application " | MEDICATED_PAD | CUTANEOUS | Status: DC | PRN

## 2016-08-10 NOTE — Progress Notes (Signed)
ANTIBIOTIC CONSULT NOTE - INITIAL  Pharmacy Consult for Gentamicin Indication: Chorioamnionitis   No Known Allergies  Patient Measurements: Height: 5' 5.5" (166.4 cm) Weight: 173 lb (78.5 kg) IBW/kg (Calculated) : 58.15 Adjusted Body Weight: 64.3kg  Vital Signs: Temp: 102.8 F (39.3 C) (10/11 0222) Temp Source: Oral (10/11 0222) BP: 117/68 (10/11 0201) Pulse Rate: 98 (10/11 0201)  Labs:  Recent Labs  08/09/16 0830  WBC 12.0*  HGB 12.8  PLT 227   No results for input(s): GENTTROUGH, GENTPEAK, GENTRANDOM in the last 72 hours.   Microbiology: No results found for this or any previous visit (from the past 720 hour(s)).  Medications:  Ampicillin 2 grams every 6 hours     Assessment: 25 y.o. female G1P0 at 7757w3d with maternal temp. Estimated Ke = 0.339, Vd = 0.4L/kg  Goal of Therapy:  Gentamicin peak 6-8 mg/L and Trough < 1 mg/L  Plan:  Gentamicin 170mg  IV every 8 hrs  Check Scr with next labs if gentamicin continued. Will check gentamicin levels if continued > 72hr or clinically indicated.  Berlin HunMendenhall, Jourdain Guay D 08/10/2016,2:40 AM

## 2016-08-10 NOTE — Progress Notes (Signed)
Pt arrived on unit at 0645 a.m. Accompanied by husband and mother. Vital signs within normal limits.

## 2016-08-10 NOTE — Progress Notes (Signed)
Patient ID: Samantha Liu, female   DOB: 04/30/1991, 25 y.o.   MRN: [id number] Samantha Liu is a 25 y.o. G1P0 at 8561w3d admitted for induction of labor due to Post dates. Due date 10/1.  Called by RN for prolonged decel  Subjective: Comfortable, no complaints  Objective: BP 120/79   Pulse 95   Temp (!) 102.8 F (39.3 C) (Oral)   Resp 18   Ht 5' 5.5" (1.664 m)   Wt 78.5 kg (173 lb)   LMP 10/25/2015   BMI 28.35 kg/m  No intake/output data recorded.  FHT:  FHR: 145 bpm, variability: moderate,  accelerations:  Abscent,  decelerations:  Present earlies, then 6min prolonged to nadir of 70 UC:   regular, every 2-3 minutes  SVE:   Dilation: 10 Effacement (%): 100 Station: +2 Exam by:: Samantha Liu, CNM  Pitocin @ 0 mu/min  Labs: Lab Results  Component Value Date   WBC 12.0 (H) 08/09/2016   HGB 12.8 08/09/2016   HCT 36.6 08/09/2016   MCV 88.8 08/09/2016   PLT 227 08/09/2016    Assessment / Plan: IOL d/t postdates, called for prolonged, now 10/100/+2, prolonged resolved. Notified Dr. Vergie Liu, call if decels/fetal distress w/ pushing and will come evaluate for forceps. Will give fetus time to recuperate from decel before start pushing  Labor: Progressing normally Fetal Wellbeing:  Category II Pain Control:  Epidural Pre-eclampsia: n/a I/D:  Amp/gent for Triple I Anticipated MOD:  NSVD  Samantha Liu, Samantha Liu CNM, WHNP-BC 08/10/2016, 3:25 AM

## 2016-08-10 NOTE — Progress Notes (Signed)
[facility]  Delivery Note:  SVD    08/10/2016  4:26 AM  I was called to the operating room at the request of the patient's obstetrician (Dr. Vergie LivingPickens) for vacuum extraction and non-reassuirng fetal heart tones in the setting of suspected chorioamnionitis.  PRENATAL HX:  This is a 25 y/o G1P0 at 5141 and 3/[redacted] weeks gestation who was admitted on 10/10 for IOL due to post dates.  Her pregnancy has been uncomplicated.  She developed a temperature as high as 102.8 and received Ampicillin and Gentamicin about an hour and half prior to delivery.  She is GBS negative.  ROM x10.5 hours with light meconium.  Piror to delivery there were persistent early decelerations as well as a prolonged deceration for about 6 minutes.  Delivery was by vacuum extraction.  DELIVERY:  Infant was vigorous at delivery, requiring no resuscitation other than standard warming, drying and stimulation.  APGARs 8 and 9.  Exam within normal limits.  However, per [facility] sepisis calculator, risk of early onset sepsis is significnatly elevated (3.14/1000 in a well appearing baby) and empiric antibiotic coverage is recommended.  Will therefore admit the infant to the NICU to evaluate for sepsis.     _____________________ Electronically Signed By: Maryan CharLindsey Danamarie Minami, MD Neonatologist

## 2016-08-10 NOTE — Progress Notes (Signed)
Patient ID: Corine ShelterShaquan Pincock, female   DOB: [birth year], 25 y.o.   MRN: [id number] Corine ShelterShaquan Finlayson is a 25 y.o. G1P0 at 213w3d admitted for induction of labor due to Post dates. Due date 10/1.  Subjective: Comfortable, no complaints  Objective: BP 117/68   Pulse 98   Temp (!) 102.8 F (39.3 C) (Oral)   Resp 18   Ht 5' 5.5" (1.664 m)   Wt 78.5 kg (173 lb)   LMP 10/25/2015   BMI 28.35 kg/m  No intake/output data recorded.  FHT:  FHR: 165 bpm, variability: moderate,  accelerations:  Abscent,  decelerations:  Present earlies, then 5min prolonged UC:   regular, every 2-3 minutes  SVE:   Dilation: 7 Effacement (%): 90 Station: 0 Exam by:: Genella RifeK. Kimeka Badour, CNM caput at +1  Pitocin @ 0 mu/min  Labs: Lab Results  Component Value Date   WBC 12.0 (H) 08/09/2016   HGB 12.8 08/09/2016   HCT 36.6 08/09/2016   MCV 88.8 08/09/2016   PLT 227 08/09/2016    Assessment / Plan: IOL d/t postdates, prolonged decel x 305mins-O2 on- position changes-IV fluid bolus, no cervical change, unable to restart pitocin d/t fetal intolerance, now w/ temp 102.8/Triple I, will treat w/ amp/gent and apap, will start amnioinfusion per protocol. Updated Dr. Vergie LivingPickens of all, recheck cx in 2hr if no change let him know, if prolonged occurs again give terb  Labor: protracted Fetal Wellbeing:  Category II Pain Control:  Epidural Pre-eclampsia: n/a I/D:  Amp/gent for Triple I Anticipated MOD:  undetermined  Marge DuncansBooker, Azul Brumett Randall CNM, WHNP-BC 08/10/2016, 2:34 AM

## 2016-08-10 NOTE — Lactation Note (Signed)
This note was copied from a baby's chart. Lactation Consultation Note  Patient Name: Samantha Corine ShelterShaquan Limes ZOXWR'UToday's Date: 08/10/2016 Reason for consult: Initial assessment;NICU baby   Assisted mom in NICU with feeding infant at breast. Infant recently had a bath and was quiet alert in mom's arms. Placed infant STS with mom and was able to hand express a gtt of colostrum. Infant licked at breast and did not latch. Enc mom to come and feed infant at breast as often as she can. Mom reports infant BF earlier and did well. Discussed with mom that infant may BF better at times than others and to keep practicing. Enc mom to place infant STS at much as possible. Mom has not started pumping yet, Told her I will follow up with her in her room to set up pump. Mom voiced understanding. Follow up later today.    Maternal Data Does the patient have breastfeeding experience prior to this delivery?: No  Feeding Feeding Type: Breast Fed Length of feed: 5 min  LATCH Score/Interventions Latch: Too sleepy or reluctant, no latch achieved, no sucking elicited. Intervention(s): Skin to skin;Teach feeding cues;Waking techniques  Audible Swallowing: None Intervention(s): Skin to skin;Hand expression  Type of Nipple: Everted at rest and after stimulation  Comfort (Breast/Nipple): Filling, red/small blisters or bruises, mild/mod discomfort  Problem noted: Mild/Moderate discomfort  Hold (Positioning): Assistance needed to correctly position infant at breast and maintain latch. Intervention(s): Breastfeeding basics reviewed;Support Pillows;Skin to skin  LATCH Score: 4  Lactation Tools Discussed/Used WIC Program: Yes   Consult Status Consult Status: Follow-up Date: 08/10/16 Follow-up type: In-patient    Silas FloodSharon S Hice 08/10/2016, 10:43 AM

## 2016-08-10 NOTE — Progress Notes (Signed)
Patient ID: Samantha Liu, female   DOB: 1991-05-26, 25 y.o.   MRN: [id number] Samantha Liu is a 25 y.o. G1P0 at 2524w3d admitted for induction of labor due to Post dates. Due date 10/1.  Subjective: Comfortable, no complaints  Objective: BP 120/74   Pulse 98   Temp 98.9 F (37.2 C) (Axillary)   Resp 18   Ht 5' 5.5" (1.664 m)   Wt 78.5 kg (173 lb)   LMP 10/25/2015   BMI 28.35 kg/m  No intake/output data recorded.  FHT:  FHR: 155 bpm, variability: minimal ,  accelerations:  Abscent,  decelerations:  Present earlies, 6min prolonged- pit turned off @ 0100 UC:   regular, every 2-3 minutes MVUs ~160 w/ pit off  SVE:   Dilation: 7 Effacement (%): 100 Station: 0, +1 Exam by:: Cletis MediaK. Anderson, RN  Pitocin @ 0 mu/min  Labs: Lab Results  Component Value Date   WBC 12.0 (H) 08/09/2016   HGB 12.8 08/09/2016   HCT 36.6 08/09/2016   MCV 88.8 08/09/2016   PLT 227 08/09/2016    Assessment / Plan: IOL d/t postdates, had placed IUPC earlier d/t protracted active phase, MVUs were inadequate so pitocin was restarted- pitocin was at 812mu/min and baby had 6min prolonged so pit d/c'd, still contracting q 2-723mins on her own, cx 7cm at last check so has made some change, updated Dr. Vergie LivingPickens on all including current minimal variability- will continue to monitor- call back if not improving. Leave pit off for now  Labor: slow Fetal Wellbeing:  Category II Pain Control:  Epidural Pre-eclampsia: n/a I/D:  n/a Anticipated MOD:  hopeful for SVB  Marge DuncansBooker, Samantha Liu CNM, WHNP-BC 08/10/2016, 1:39 AM

## 2016-08-10 NOTE — Anesthesia Postprocedure Evaluation (Signed)
Anesthesia Post Note  Patient: Samantha Liu  Procedure(s) Performed: * No procedures listed *  Patient location during evaluation: [facility] Anesthesia Type: Epidural Level of consciousness: awake and alert Pain management: pain level controlled Vital Signs Assessment: post-procedure vital signs reviewed and stable Respiratory status: spontaneous breathing, nonlabored ventilation and respiratory function stable Cardiovascular status: stable Postop Assessment: no headache, no backache and epidural receding Anesthetic complications: no     Last Vitals:  Vitals:   08/10/16 0646 08/10/16 0800  BP: 108/66 99/64  Pulse: 87 86  Resp: 16 16  Temp: 37.7 C 36.9 C    Last Pain:  Vitals:   08/10/16 0800  TempSrc: Oral  PainSc: 0-No pain   Pain Goal:                 Kayon Dozier

## 2016-08-11 MED ORDER — SENNOSIDES-DOCUSATE SODIUM 8.6-50 MG PO TABS: 1.0000 | ORAL_TABLET | Freq: Every day | ORAL | 1 refills | Status: DC

## 2016-08-11 MED ORDER — IBUPROFEN 600 MG PO TABS: 600.0000 mg | ORAL_TABLET | Freq: Four times a day (QID) | ORAL | 0 refills | Status: DC | PRN

## 2016-08-11 NOTE — Progress Notes (Signed)
CSW acknowledges NICU admission.    Patient screened out for psychosocial assessment since none of the following apply:  Psychosocial stressors documented in mother or baby's chart  Gestation less than 32 weeks  Code at delivery   Infant with anomalies  CSW informed by NICU staff that baby will room in tonight with plan to discharge tomorrow.  No concerns noted by staff.  Please contact the Clinical Social Worker if specific needs arise, or by MOB's request.

## 2016-08-11 NOTE — Discharge Summary (Signed)
OB Discharge Summary     Patient Name: Samantha ShelterShaquan Sylla DOB: 03-30-1991 MRN: [id number]  Date of admission: 08/09/2016 Delivering MD: [place] BingPICKENS, CHARLIE   Date of discharge: 08/11/2016  Admitting diagnosis: INDUCTION Intrauterine pregnancy: 2658w3d     Secondary diagnosis:  Active Problems:   Post-dates pregnancy  Additional problems: none     Discharge diagnosis: Term Pregnancy Delivered                                                                                                Post partum procedures:none  Augmentation: Pitocin and Foley Balloon  Complications: Intrauterine Inflammation or infection (Chorioamniotis)  Hospital course:  Induction of Labor With Vacuum Assisted Vaginal Delivery   25 y.o. yo G1P1001 at 3058w3d was admitted to the hospital 08/09/2016 for induction of labor.  Indication for induction: Postdates.  Patient had an uncomplicated labor course as follows: Membrane Rupture Time/Date: 4:00 PM ,08/09/2016   Intrapartum Procedures: Episiotomy: None [1]                                         Lacerations:  Periurethral [8];Perineal [11];1st degree [2]  Patient had delivery of a Viable infant.  Information for the patient's newborn:  Sanda LingerClarke, Girl Zaya [[id number]][[id number]]      08/10/2016  Details of VAVD delivery can be found in separate delivery note.  Patient had a routine postpartum course. Patient is discharged home 08/11/16.   Physical exam  Vitals:   08/10/16 1823 08/10/16 2228 08/11/16 0513 08/11/16 1452  BP: 120/86 121/84 98/60 (!) 106/59  Pulse: 70 93 82 79  Resp: 18 18 18 17   Temp: 97.8 F (36.6 C) 97.7 F (36.5 C) 97.9 F (36.6 C) 98.3 F (36.8 C)  TempSrc: Oral Oral Oral Oral  SpO2: 100% 100% 100% 100%  Weight:      Height:       General: alert, cooperative and no distress Lochia: appropriate Uterine Fundus: firm Incision: N/A DVT Evaluation: No evidence of DVT seen on physical exam. Labs: Lab Results  Component Value Date   WBC 21.6  (H) 08/10/2016   HGB 11.2 (L) 08/10/2016   HCT 32.3 (L) 08/10/2016   MCV 88.7 08/10/2016   PLT 200 08/10/2016   No flowsheet data found.  Discharge instruction: per After Visit Summary and "Baby and Me Booklet".  After visit meds:    Medication List    STOP taking these medications   oxyCODONE-acetaminophen 5-325 MG tablet Commonly known as:  PERCOCET/ROXICET     TAKE these medications   acetaminophen 500 MG tablet Commonly known as:  TYLENOL Take 1,000 mg by mouth every 6 (six) hours as needed for moderate pain.   ibuprofen 600 MG tablet Commonly known as:  ADVIL,MOTRIN Take 1 tablet (600 mg total) by mouth every 6 (six) hours as needed.   omeprazole 20 MG capsule Commonly known as:  PRILOSEC Take 1 capsule (20 mg total) by mouth 2 (two) times daily before a meal.   PRENATE MINI 18-0.6-0.4-350  MG Caps Take 1 capsule by mouth daily before breakfast.   senna-docusate 8.6-50 MG tablet Commonly known as:  Senokot-S Take 1 tablet by mouth at bedtime.       Diet: routine diet  Activity: Advance as tolerated. Pelvic rest for 6 weeks.   Outpatient follow up:6 weeks Follow up Appt:No future appointments. Follow up Visit:No Follow-up on file.  Postpartum contraception: Depo Provera  Newborn Data: Live born female  Birth Weight: 6 lb 8.1 oz (2950 g) APGAR: 8, 9  Baby Feeding: Bottle and Breast Disposition:NICU with anticipated discharge tomorrow morning   08/11/2016 Tillman Sers, DO

## 2016-08-11 NOTE — Discharge Instructions (Signed)

## 2016-08-11 NOTE — Lactation Note (Signed)
This note was copied from a baby's chart. Lactation Consultation Note  Patient Name: Samantha Liu ZOXWR'UToday's Date: 08/11/2016 Reason for consult: Follow-up assessment;NICU baby  NICU baby 6032 hours old. Mom attempting to nurse when this LC came to baby's bedside. Assisted mom to continue latching in cross-cradle position, but baby not sustaining a latch. Mom not able to see baby's mouth well, so assisted mom to latch baby in football position. Baby latched deeply several times with a few swallows, but baby fussy at the breast. Mom reported increased comfort in football position. However, mom reports that she has only used DEBP once. Discussed flow of breast milk with mom, and that it appears baby pulling off because there is not enough flow at the breast. Enc mom to pump when she returns to the room, and to pump every 2-3 hours for 15 minutes followed by hand expression--for a total of 8 times/24 hours at least.   [facility] in next Friday--a week from tomorrow. Discussed WIC loaner with mom--given at discharge. Mom states no additional questions at this time. Maternal Data    Feeding Feeding Type: Breast Fed Nipple Type: Regular Length of feed:  (LC assessed 15 minutes of BF--off-and-on.)  LATCH Score/Interventions Latch: Repeated attempts needed to sustain latch, nipple held in mouth throughout feeding, stimulation needed to elicit sucking reflex. Intervention(s): Skin to skin;Waking techniques Intervention(s): Adjust position;Assist with latch;Breast compression  Audible Swallowing: A few with stimulation  Type of Nipple: Everted at rest and after stimulation  Comfort (Breast/Nipple): Soft / non-tender     Hold (Positioning): Assistance needed to correctly position infant at breast and maintain latch. Intervention(s): Breastfeeding basics reviewed;Support Pillows;Position options;Skin to skin  LATCH Score: 7  Lactation Tools  Discussed/Used     Consult Status Consult Status: PRN    Sherlyn HayJennifer D Scherrie Seneca 08/11/2016, 12:48 PM

## 2016-08-11 NOTE — Progress Notes (Signed)
Post Partum Day 1 s/p vacuum extr vag del for Chorioamnionitis, x 2 hr, and FHR decels. Baby in NICU, to be assessed today and length of antibiotics to be decided. Pt has agreed to take TDAP after discussion Subjective: no complaints, up ad lib, voiding, tolerating PO and + flatus breast feeding, BC depo  Objective: Blood pressure 98/60, pulse 82, temperature 97.9 F (36.6 C), temperature source Oral, resp. rate 18, height 5' 5.5" (1.664 m), weight 78.5 kg (173 lb), last menstrual period 10/25/2015, SpO2 100 %, unknown if currently breastfeeding.  Physical Exam:  General: alert, cooperative and no distress Lochia: appropriate Uterine Fundus: firm Incision:  DVT Evaluation: No evidence of DVT seen on physical exam.   Recent Labs  08/09/16 0830 08/10/16 1754  HGB 12.8 11.2*  HCT 36.6 32.3*    Assessment/Plan: Plan for discharge tomorrow  TDAP today   LOS: 2 days   Eloise Mula V 08/11/2016, 8:45 AM

## 2016-08-12 ENCOUNTER — Ambulatory Visit: Payer: Self-pay

## 2016-08-12 NOTE — Lactation Note (Signed)
This note was copied from a baby's chart. Lactation Consultation Note  Patient Name: [name] RUEAV'WToday's Date: 08/12/2016     [facility] Taught mother how to syringe finger feed. Suggest once she returns home she can also supplement w/ slow flow nipple if volume becomes difficult to manage with syringe.   Maternal Data    Feeding Feeding Type: Formula Nipple Type: Slow - flow  LATCH Score/Interventions                      Lactation Tools Discussed/Used     Consult Status      [name], [name] 08/12/2016, 3:37 PM

## 2016-08-12 NOTE — Lactation Note (Signed)
This note was copied from a baby's chart. Lactation Consultation Note  Patient Name: Samantha Corine ShelterShaquan Wollin ZOXWR'UToday's Date: 08/12/2016 Reason for consult: Follow-up assessment;NICU baby;Pump rental NICU baby 2353 hours old. Mom reports that baby not nursing, but mom is pumping and supplementing baby. Discussed with mom that baby is used to the flow of the bottle. Stressed the importance of putting the baby to breast first, then supplementing and then post-pumping. [facility] and has LC extension to call when ready for the pump. Mom aware of OP/BFSG and LC phone line assistance after D/C.   Maternal Data    Feeding Feeding Type: Formula Nipple Type: Slow - flow  LATCH Score/Interventions                      Lactation Tools Discussed/Used     Consult Status Consult Status: PRN    Sherlyn HayJennifer D Jevaeh Shams 08/12/2016, 10:12 AM

## 2016-08-16 ENCOUNTER — Telehealth (HOSPITAL_COMMUNITY): Payer: Self-pay | Admitting: Lactation Services

## 2016-08-16 NOTE — Telephone Encounter (Signed)
Mother states she has been pumping instead of breastfeeding due to difficult latch and sore nipples.  She states she has started supplementing with formula and now the baby prefers the formula to the breastmilk - drinks more formula.  No smell to breastmilk.  Encouraged mother it may be a phase.  Don't make breastmilk too hot for baby.  Mother asked how to dry up breastmilk.  Gave information regarding cabbage leaves.  Discussed benefits to baby of breastfeeding.

## 2016-09-20 ENCOUNTER — Encounter: Payer: Self-pay | Admitting: Obstetrics

## 2016-09-20 ENCOUNTER — Ambulatory Visit (INDEPENDENT_AMBULATORY_CARE_PROVIDER_SITE_OTHER): Payer: Medicaid Other | Admitting: Obstetrics

## 2016-09-20 DIAGNOSIS — Z30013 Encounter for initial prescription of injectable contraceptive: Secondary | ICD-10-CM

## 2016-09-20 MED ORDER — MEDROXYPROGESTERONE ACETATE 150 MG/ML IM SUSP: 150.0000 mg | INTRAMUSCULAR | 4 refills | Status: DC

## 2016-09-20 NOTE — Progress Notes (Signed)
Subjective:     Samantha Liu is a 25 y.o. female who presents for a postpartum visit. She is 6 weeks postpartum following a spontaneous vaginal delivery. I have fully reviewed the prenatal and intrapartum course. The delivery was at 41 gestational weeks. Outcome: spontaneous vaginal delivery. Anesthesia: epidural. Postpartum course has been normal. Baby's course has been normal. Baby is feeding by bottle - Enfamil with Iron. Bleeding thin lochia. Bowel function is normal. Bladder function is normal. Patient is not sexually active. Contraception method is abstinence. Postpartum depression screening: negative.  Tobacco, alcohol and substance abuse history reviewed.  Adult immunizations reviewed including TDAP, rubella and varicella.  The following portions of the patient's history were reviewed and updated as appropriate: allergies, current medications, past family history, past medical history, past social history, past surgical history and problem list.  Review of Systems A comprehensive review of systems was negative.   Objective:    BP 117/78   Pulse 92   Temp 98.3 F (36.8 C) (Oral)   Ht 5' 5.5" (1.664 m)   Wt 151 lb 6.4 oz (68.7 kg)   Breastfeeding? No Comment: Stopped breastfeeding two weeks ago  BMI 24.81 kg/m   General:  alert and no distress   Breasts:  inspection negative, no nipple discharge or bleeding, no masses or nodularity palpable  Lungs: clear to auscultation bilaterally  Heart:  regular rate and rhythm, S1, S2 normal, no murmur, click, rub or gallop  Abdomen: soft, non-tender; bowel sounds normal; no masses,  no organomegaly   Vulva:  normal  Vagina: normal vagina  Cervix:  no cervical motion tenderness  Corpus: normal size, contour, position, consistency, mobility, non-tender  Adnexa:  no mass, fullness, tenderness  Rectal Exam: Not performed.          50% of 15 min visit spent on counseling and coordination of care.    Assessment:     Normal postpartum exam.  Pap smear not done at today's visit.     Contraceptive Counseling and Advice  Plan:    1. Contraception: Depo-Provera injections 2. Depo Provera Rx 3. Follow up in: 3 months or as needed.   Healthy lifestyle practices reviewed

## 2016-09-26 ENCOUNTER — Ambulatory Visit (INDEPENDENT_AMBULATORY_CARE_PROVIDER_SITE_OTHER): Payer: Medicaid Other | Admitting: *Deleted

## 2016-09-26 VITALS — BP 115/77 | HR 77 | Wt 153.0 lb

## 2016-09-26 DIAGNOSIS — Z3042 Encounter for surveillance of injectable contraceptive: Secondary | ICD-10-CM | POA: Diagnosis not present

## 2016-09-26 MED ORDER — MEDROXYPROGESTERONE ACETATE 150 MG/ML IM SUSP
150.0000 mg | Freq: Once | INTRAMUSCULAR | Status: AC
  Administered 2016-09-26: 150 mg via INTRAMUSCULAR

## 2016-10-11 ENCOUNTER — Encounter: Payer: Self-pay | Admitting: *Deleted

## 2016-12-14 ENCOUNTER — Telehealth: Payer: Self-pay

## 2016-12-14 NOTE — Telephone Encounter (Signed)
Patient called in stating that she has been bleeding for the last 3 months and that she has been on Depo, patient also stated that she has an appt on Monday and is wanting to switch to the pill. Advised patient that she would need to be on a provider's schedule because she is currently scheduled on a nurse visit. Transferred to scheduling.

## 2016-12-16 ENCOUNTER — Ambulatory Visit: Payer: Medicaid Other | Admitting: Obstetrics

## 2016-12-19 ENCOUNTER — Ambulatory Visit: Payer: Medicaid Other

## 2016-12-19 ENCOUNTER — Ambulatory Visit: Payer: Medicaid Other | Admitting: Obstetrics

## 2017-01-09 ENCOUNTER — Ambulatory Visit: Payer: Self-pay | Admitting: Obstetrics

## 2017-01-17 ENCOUNTER — Encounter: Payer: Self-pay | Admitting: Obstetrics

## 2017-01-17 ENCOUNTER — Ambulatory Visit (INDEPENDENT_AMBULATORY_CARE_PROVIDER_SITE_OTHER): Payer: Medicaid Other | Admitting: Obstetrics

## 2017-01-17 VITALS — BP 127/83 | Ht 65.5 in | Wt 148.0 lb

## 2017-01-17 DIAGNOSIS — Z30011 Encounter for initial prescription of contraceptive pills: Secondary | ICD-10-CM | POA: Diagnosis not present

## 2017-01-17 DIAGNOSIS — Z0000 Encounter for general adult medical examination without abnormal findings: Secondary | ICD-10-CM

## 2017-01-17 MED ORDER — NORETHIN ACE-ETH ESTRAD-FE 1-20 MG-MCG(24) PO CAPS: 1.0000 | ORAL_CAPSULE | Freq: Every day | ORAL | 11 refills | Status: DC

## 2017-01-17 MED ORDER — PRENATE PIXIE 10-0.6-0.4-200 MG PO CAPS: 1.0000 | ORAL_CAPSULE | Freq: Every day | ORAL | 11 refills | Status: DC

## 2017-01-17 NOTE — Progress Notes (Signed)
Subjective:    Samantha Liu is a 26 y.o. female who presents for contraception counseling. The patient has no complaints today. The patient is sexually active. Pertinent past medical history: none.  The information documented in the HPI was reviewed and verified.  Menstrual History: OB History    Gravida Para Term Preterm AB Living   1 1 1     1    SAB TAB Ectopic Multiple Live Births         0 1     No LMP recorded (lmp unknown).   Patient Active Problem List   Diagnosis Date Noted  . Post-dates pregnancy 08/09/2016  . Supervision of normal first pregnancy in third trimester 06/22/2016   History reviewed. No pertinent past medical history.  Past Surgical History:  Procedure Laterality Date  . NO PAST SURGERIES       Current Outpatient Prescriptions:  .  medroxyPROGESTERone (DEPO-PROVERA) 150 MG/ML injection, Inject 1 mL (150 mg total) into the muscle every 3 (three) months., Disp: 1 mL, Rfl: 4 .  acetaminophen (TYLENOL) 500 MG tablet, Take 1,000 mg by mouth every 6 (six) hours as needed for moderate pain., Disp: , Rfl:  .  Norethin Ace-Eth Estrad-FE (TAYTULLA) 1-20 MG-MCG(24) CAPS, Take 1 capsule by mouth daily before breakfast., Disp: 28 capsule, Rfl: 11 .  Prenat-FeAsp-Meth-FA-DHA w/o A (PRENATE PIXIE) 10-0.6-0.4-200 MG CAPS, Take 1 capsule by mouth daily before breakfast., Disp: 30 capsule, Rfl: 11 No Known Allergies  Social History  Substance Use Topics  . Smoking status: Never Smoker  . Smokeless tobacco: Never Used  . Alcohol use No    History reviewed. No pertinent family history.     Review of Systems Constitutional: negative for weight loss Genitourinary:negative for abnormal menstrual periods and vaginal discharge   Objective:   BP 127/83   Ht 5' 5.5" (1.664 m)   Wt 148 lb (67.1 kg)   LMP  (LMP Unknown) Comment: Bleeding x 4 moa   BMI 24.25 kg/m    PE:  Deferred  Lab Review Urine pregnancy test Labs reviewed yes Radiologic studies reviewed  no  >50% of 10 min visit spent on counseling and coordination of care.    Assessment:    26 y.o., discontinuing Depo-Provera injections because of AUB.  She is starting OCP's,  no contraindications.   Plan:    All questions answered. Contraception: OCP (estrogen/progesterone). Discussed healthy lifestyle modifications. Follow up in 3 months.    Meds ordered this encounter  Medications  . Norethin Ace-Eth Estrad-FE (TAYTULLA) 1-20 MG-MCG(24) CAPS    Sig: Take 1 capsule by mouth daily before breakfast.    Dispense:  28 capsule    Refill:  11  . Prenat-FeAsp-Meth-FA-DHA w/o A (PRENATE PIXIE) 10-0.6-0.4-200 MG CAPS    Sig: Take 1 capsule by mouth daily before breakfast.    Dispense:  30 capsule    Refill:  11   No orders of the defined types were placed in this encounter.

## 2017-01-17 NOTE — Progress Notes (Signed)
Pt requests to change BC from Depo to pill d/t prolonged vaginal bleeding. Pt is no longer Breast feeding.

## 2017-05-08 ENCOUNTER — Other Ambulatory Visit: Payer: Self-pay | Admitting: Certified Nurse Midwife

## 2017-06-21 ENCOUNTER — Ambulatory Visit: Payer: Medicaid Other | Admitting: Obstetrics

## 2018-06-05 ENCOUNTER — Encounter: Payer: Self-pay | Admitting: Obstetrics

## 2018-06-05 ENCOUNTER — Other Ambulatory Visit: Payer: Self-pay

## 2018-06-05 ENCOUNTER — Ambulatory Visit (INDEPENDENT_AMBULATORY_CARE_PROVIDER_SITE_OTHER): Payer: Self-pay | Admitting: Obstetrics

## 2018-06-05 VITALS — BP 115/80 | HR 84 | Ht 65.0 in | Wt 131.7 lb

## 2018-06-05 DIAGNOSIS — Z124 Encounter for screening for malignant neoplasm of cervix: Secondary | ICD-10-CM | POA: Diagnosis not present

## 2018-06-05 DIAGNOSIS — Z01419 Encounter for gynecological examination (general) (routine) without abnormal findings: Secondary | ICD-10-CM

## 2018-06-05 DIAGNOSIS — Z113 Encounter for screening for infections with a predominantly sexual mode of transmission: Secondary | ICD-10-CM

## 2018-06-05 DIAGNOSIS — N898 Other specified noninflammatory disorders of vagina: Secondary | ICD-10-CM

## 2018-06-05 DIAGNOSIS — Z3169 Encounter for other general counseling and advice on procreation: Secondary | ICD-10-CM

## 2018-06-05 NOTE — Progress Notes (Signed)
Subjective:        Samantha Liu is a 27 y.o. female here for a routine exam.  Current complaints: Trying to get pregnant for past year without success.    Personal health questionnaire:  Is patient Ashkenazi Jewish, have a family history of breast and/or ovarian cancer: no Is there a family history of uterine cancer diagnosed at age < 79, gastrointestinal cancer, urinary tract cancer, family member who is a Personnel officer syndrome-associated carrier: no Is the patient overweight and hypertensive, family history of diabetes, personal history of gestational diabetes, preeclampsia or PCOS: no Is patient over 66, have PCOS,  family history of premature CHD under age 85, diabetes, smoke, have hypertension or peripheral artery disease:  no At any time, has a partner hit, kicked or otherwise hurt or frightened you?: no Over the past 2 weeks, have you felt down, depressed or hopeless?: no Over the past 2 weeks, have you felt little interest or pleasure in doing things?:no   Gynecologic History Patient's last menstrual period was 05/26/2018. Contraception: none Last Pap: unknown. Results were: unknown Last mammogram: n/a. Results were: n/a  Obstetric History OB History  Gravida Para Term Preterm AB Living  1 1 1     1   SAB TAB Ectopic Multiple Live Births        0 1    # Outcome Date GA Lbr Len/2nd Weight Sex Delivery Anes PTL Lv  1 Term 08/10/16 [redacted]w[redacted]d 11:03 / 01:09 6 lb 8.1 oz (2.95 kg) F Vag-Vacuum EPI  LIV    History reviewed. No pertinent past medical history.  Past Surgical History:  Procedure Laterality Date  . NO PAST SURGERIES       Current Outpatient Medications:  .  acetaminophen (TYLENOL) 500 MG tablet, Take 1,000 mg by mouth every 6 (six) hours as needed for moderate pain., Disp: , Rfl:  .  medroxyPROGESTERone (DEPO-PROVERA) 150 MG/ML injection, Inject 1 mL (150 mg total) into the muscle every 3 (three) months. (Patient not taking: Reported on 06/05/2018), Disp: 1 mL, Rfl:  4 .  Norethin Ace-Eth Estrad-FE (TAYTULLA) 1-20 MG-MCG(24) CAPS, Take 1 capsule by mouth daily before breakfast. (Patient not taking: Reported on 06/05/2018), Disp: 28 capsule, Rfl: 11 .  Prenat-FeAsp-Meth-FA-DHA w/o A (PRENATE PIXIE) 10-0.6-0.4-200 MG CAPS, Take 1 capsule by mouth daily before breakfast. (Patient not taking: Reported on 06/05/2018), Disp: 30 capsule, Rfl: 11 No Known Allergies  Social History   Tobacco Use  . Smoking status: Never Smoker  . Smokeless tobacco: Never Used  Substance Use Topics  . Alcohol use: No    Alcohol/week: 0.0 oz    History reviewed. No pertinent family history.    Review of Systems  Constitutional: negative for fatigue and weight loss Respiratory: negative for cough and wheezing Cardiovascular: negative for chest pain, fatigue and palpitations Gastrointestinal: negative for abdominal pain and change in bowel habits Musculoskeletal:negative for myalgias Neurological: negative for gait problems and tremors Behavioral/Psych: negative for abusive relationship, depression Endocrine: negative for temperature intolerance    Genitourinary:negative for abnormal menstrual periods, genital lesions, hot flashes, sexual problems and vaginal discharge Integument/breast: negative for breast lump, breast tenderness, nipple discharge and skin lesion(s)    Objective:       BP 115/80   Pulse 84   Ht 5\' 5"  (1.651 m)   Wt 131 lb 11.2 oz (59.7 kg)   LMP 05/26/2018   Breastfeeding? No   BMI 21.92 kg/m  General:   alert  Skin:   no rash  or abnormalities  Lungs:   clear to auscultation bilaterally  Heart:   regular rate and rhythm, S1, S2 normal, no murmur, click, rub or gallop  Breasts:   normal without suspicious masses, skin or nipple changes or axillary nodes  Abdomen:  normal findings: no organomegaly, soft, non-tender and no hernia  Pelvis:  External genitalia: normal general appearance Urinary system: urethral meatus normal and bladder without  fullness, nontender Vaginal: normal without tenderness, induration or masses Cervix: normal appearance Adnexa: normal bimanual exam Uterus: anteverted and non-tender, normal size   Lab Review Urine pregnancy test Labs reviewed yes Radiologic studies reviewed no  50% of 20 min visit spent on counseling and coordination of care.   Assessment:     1. Encounter for routine gynecological examination with Papanicolaou smear of cervix Rx: - Cytology - PAP  2. Vaginal discharge Rx: - Cervicovaginal ancillary only  3. Encounter for preconception consultation - folic acid recommended - discussed diet and lifestyle changes - all questions answered    Plan:    Education reviewed: calcium supplements, depression evaluation, low fat, low cholesterol diet, safe sex/STD prevention, self breast exams and weight bearing exercise. Follow up in: 1 year.   No orders of the defined types were placed in this encounter.  No orders of the defined types were placed in this encounter.   Brock BadHARLES A. HARPER MD 06-05-2018

## 2018-06-05 NOTE — Progress Notes (Signed)
Presents for AEX/PAP/Cultures.  Trying to conceive for 1 year.

## 2018-06-05 NOTE — Patient Instructions (Signed)
Preparing for Pregnancy If you are considering becoming pregnant, make an appointment to see your regular health care provider to learn how to prepare for a safe and healthy pregnancy (preconception care). During a preconception care visit, your health care provider will:  Do a complete physical exam, including a Pap test.  Take a complete medical history.  Give you information, answer your questions, and help you resolve problems.  Preconception checklist Medical history  Tell your health care provider about any current or past medical conditions. Your pregnancy or your ability to become pregnant may be affected by chronic conditions, such as diabetes, chronic hypertension, and thyroid problems.  Include your family's medical history as well as your partner's medical history.  Tell your health care provider about any history of STIs (sexually transmitted infections).These can affect your pregnancy. In some cases, they can be passed to your baby. Discuss any concerns that you have about STIs.  If indicated, discuss the benefits of genetic testing. This testing will show whether there are any genetic conditions that may be passed from you or your partner to your baby.  Tell your health care provider about: ? Any problems you have had with conception or pregnancy. ? Any medicines you take. These include vitamins, herbal supplements, and over-the-counter medicines. ? Your history of immunizations. Discuss any vaccinations that you may need.  Diet  Ask your health care provider what to include in a healthy diet that has a balance of nutrients. This is especially important when you are pregnant or preparing to become pregnant.  Ask your health care provider to help you reach a healthy weight before pregnancy. ? If you are overweight, you may be at higher risk for certain complications, such as high blood pressure, diabetes, and preterm birth. ? If you are underweight, you are more likely  to have a baby who has a low birth weight.  Lifestyle, work, and home  Let your health care provider know: ? About any lifestyle habits that you have, such as alcohol use, drug use, or smoking. ? About recreational activities that may put you at risk during pregnancy, such as downhill skiing and certain exercise programs. ? Tell your health care provider about any international travel, especially any travel to places with an active Zika virus outbreak. ? About harmful substances that you may be exposed to at work or at home. These include chemicals, pesticides, radiation, or even litter boxes. ? If you do not feel safe at home.  Mental health  Tell your health care provider about: ? Any history of mental health conditions, including feelings of depression, sadness, or anxiety. ? Any medicines that you take for a mental health condition. These include herbs and supplements.  Home instructions to prepare for pregnancy Lifestyle  Eat a balanced diet. This includes fresh fruits and vegetables, whole grains, lean meats, low-fat dairy products, healthy fats, and foods that are high in fiber. Ask to meet with a nutritionist or registered dietitian for assistance with meal planning and goals.  Get regular exercise. Try to be active for at least 30 minutes a day on most days of the week. Ask your health care provider which activities are safe during pregnancy.  Do not use any products that contain nicotine or tobacco, such as cigarettes and e-cigarettes. If you need help quitting, ask your health care provider.  Do not drink alcohol.  Do not take illegal drugs.  Maintain a healthy weight. Ask your health care provider what weight range is   right for you.  General instructions  Keep an accurate record of your menstrual periods. This makes it easier for your health care provider to determine your baby's due date.  Begin taking prenatal vitamins and folic acid supplements daily as directed by  your health care provider.  Manage any chronic conditions, such as high blood pressure and diabetes, as told by your health care provider. This is important.  How do I know that I am pregnant? You may be pregnant if you have been sexually active and you miss your period. Symptoms of early pregnancy include:  Mild cramping.  Very light vaginal bleeding (spotting).  Feeling unusually tired.  Nausea and vomiting (morning sickness).  If you have any of these symptoms and you suspect that you might be pregnant, you can take a home pregnancy test. These tests check for a hormone in your urine (human chorionic gonadotropin, or hCG). A woman's body begins to make this hormone during early pregnancy. These tests are very accurate. Wait until at least the first day after you miss your period to take one. If the test shows that you are pregnant (you get a positive result), call your health care provider to make an appointment for prenatal care. What should I do if I become pregnant?  Make an appointment with your health care provider as soon as you suspect you are pregnant.  Do not use any products that contain nicotine, such as cigarettes, chewing tobacco, and e-cigarettes. If you need help quitting, ask your health care provider.  Do not drink alcoholic beverages. Alcohol is related to a number of birth defects.  Avoid toxic odors and chemicals.  You may continue to have sexual intercourse if it does not cause pain or other problems, such as vaginal bleeding. This information is not intended to replace advice given to you by your health care provider. Make sure you discuss any questions you have with your health care provider. Document Released: 09/29/2008 Document Revised: 06/14/2016 Document Reviewed: 05/08/2016 Elsevier Interactive Patient Education  2018 [employer]  

## 2018-06-07 LAB — CERVICOVAGINAL ANCILLARY ONLY
CHLAMYDIA, DNA PROBE: NEGATIVE
NEISSERIA GONORRHEA: NEGATIVE

## 2018-06-08 LAB — CYTOLOGY - PAP

## 2018-06-26 ENCOUNTER — Encounter: Payer: Self-pay | Admitting: Obstetrics

## 2018-06-26 ENCOUNTER — Ambulatory Visit: Payer: 59 | Admitting: Obstetrics

## 2018-06-26 NOTE — Progress Notes (Signed)
Patient is in the office for colpo procedure. Provider advised pt to re-schedule due to recent intercourse and pt actively trying to conceive.   I have reviewed the chart and agree with nursing staff's documentation of this patient's encounter.  Coral Ceoharles Harper, MD 06/26/2018 5:01 PM

## 2018-10-30 ENCOUNTER — Telehealth: Payer: Self-pay

## 2018-10-30 NOTE — Telephone Encounter (Signed)
Returned call, no answer, left vm 

## 2018-10-30 NOTE — Telephone Encounter (Signed)
Patient called in requesting a pill to help with fertility that she said she discussed with Dr Clearance CootsHarper. Routed to provider.

## 2018-11-06 ENCOUNTER — Ambulatory Visit: Payer: 59 | Admitting: Obstetrics

## 2018-11-06 ENCOUNTER — Encounter: Payer: Self-pay | Admitting: Obstetrics

## 2018-11-06 VITALS — BP 112/79 | HR 79 | Wt 113.2 lb

## 2018-11-06 DIAGNOSIS — B9689 Other specified bacterial agents as the cause of diseases classified elsewhere: Secondary | ICD-10-CM

## 2018-11-06 DIAGNOSIS — Z3202 Encounter for pregnancy test, result negative: Secondary | ICD-10-CM | POA: Diagnosis not present

## 2018-11-06 DIAGNOSIS — N898 Other specified noninflammatory disorders of vagina: Secondary | ICD-10-CM

## 2018-11-06 DIAGNOSIS — Z113 Encounter for screening for infections with a predominantly sexual mode of transmission: Secondary | ICD-10-CM

## 2018-11-06 DIAGNOSIS — N939 Abnormal uterine and vaginal bleeding, unspecified: Secondary | ICD-10-CM

## 2018-11-06 DIAGNOSIS — N760 Acute vaginitis: Secondary | ICD-10-CM

## 2018-11-06 LAB — POCT URINE PREGNANCY: Preg Test, Ur: NEGATIVE

## 2018-11-06 NOTE — Progress Notes (Signed)
Patient ID: Samantha Liu, female   DOB: 1991/08/24, 28 y.o.   MRN: [id number]  Chief Complaint  Patient presents with  . Vaginal Bleeding    HPI [name] is a 28 y.o. female.  History of spotting in between period this past month.  She noted the onset of nausea during this period of spotting.  Denies cramping or vaginal irritation.  She is also concerned that they have been trying for conception for the past 2 years without success.  She and her partner have one child together.  Of note, she says that she has lost 20 lbs recently, probably due to stress at work. HPI  History reviewed. No pertinent past medical history.  Past Surgical History:  Procedure Laterality Date  . NO PAST SURGERIES      History reviewed. No pertinent family history.  Social History Social History   Tobacco Use  . Smoking status: Never Smoker  . Smokeless tobacco: Never Used  Substance Use Topics  . Alcohol use: No    Alcohol/week: 0.0 standard drinks  . Drug use: No    No Known Allergies  Current Outpatient Medications  Medication Sig Dispense Refill  . acetaminophen (TYLENOL) 500 MG tablet Take 1,000 mg by mouth every 6 (six) hours as needed for moderate pain.    . Prenat-FeAsp-Meth-FA-DHA w/o A (PRENATE PIXIE) 10-0.6-0.4-200 MG CAPS Take 1 capsule by mouth daily before breakfast. (Patient not taking: Reported on 06/05/2018) 30 capsule 11   No current facility-administered medications for this visit.     Review of Systems Review of Systems Constitutional: negative for fatigue and weight loss Respiratory: negative for cough and wheezing Cardiovascular: negative for chest pain, fatigue and palpitations Gastrointestinal: negative for abdominal pain and change in bowel habits Genitourinary:positive for vaginal spotting in between period Integument/breast: negative for nipple discharge Musculoskeletal:negative for myalgias Neurological: negative for gait problems and  tremors Behavioral/Psych: negative for abusive relationship, depression Endocrine: negative for temperature intolerance      Blood pressure 112/79, pulse 79, weight 113 lb 3.2 oz (51.3 kg).  Physical Exam Physical Exam           General:  Alert and no distress Abdomen:  normal findings: no organomegaly, soft, non-tender and no hernia  Pelvis:  External genitalia: normal general appearance Urinary system: urethral meatus normal and bladder without fullness, nontender Vaginal: normal without tenderness, induration or masses Cervix: normal appearance Adnexa: normal bimanual exam Uterus: anteverted and non-tender, normal size    50% of 15 min visit spent on counseling and coordination of care.   Data Reviewed Wet Prep Cultures  Assessment     1. Abnormal uterine bleeding (AUB) Rx: - POCT urine pregnancy - Cervicovaginal ancillary only( [facility])    Plan    Considering referral to Reproductive Endocrinology  Orders Placed This Encounter  Procedures  . POCT urine pregnancy   No orders of the defined types were placed in this encounter.   Brock Bad MD 11-06-2018

## 2018-11-06 NOTE — Progress Notes (Signed)
Pt is here for spotting in between period. Pts LMP 10/22/18. Pt has lost 20 lbs in the last 4 months.

## 2018-11-07 LAB — CERVICOVAGINAL ANCILLARY ONLY
Bacterial vaginitis: POSITIVE — AB
CHLAMYDIA, DNA PROBE: NEGATIVE
Candida vaginitis: NEGATIVE
NEISSERIA GONORRHEA: NEGATIVE
Trichomonas: NEGATIVE

## 2018-11-08 ENCOUNTER — Other Ambulatory Visit: Payer: Self-pay | Admitting: Obstetrics

## 2018-11-08 DIAGNOSIS — B9689 Other specified bacterial agents as the cause of diseases classified elsewhere: Secondary | ICD-10-CM

## 2018-11-08 DIAGNOSIS — N760 Acute vaginitis: Principal | ICD-10-CM

## 2018-11-08 MED ORDER — TINIDAZOLE 500 MG PO TABS: 1000.0000 mg | ORAL_TABLET | Freq: Every day | ORAL | 2 refills | Status: DC

## 2018-11-14 ENCOUNTER — Other Ambulatory Visit: Payer: Self-pay

## 2018-11-14 DIAGNOSIS — B9689 Other specified bacterial agents as the cause of diseases classified elsewhere: Secondary | ICD-10-CM

## 2018-11-14 DIAGNOSIS — N760 Acute vaginitis: Principal | ICD-10-CM

## 2018-11-14 MED ORDER — TINIDAZOLE 500 MG PO TABS: 1000.0000 mg | ORAL_TABLET | Freq: Every day | ORAL | 2 refills | Status: DC

## 2018-11-14 NOTE — Progress Notes (Signed)
Patient called and stated [facility] "is no longer a good fit for her" Requested Rx be sent to [facility] . Rx resent as pt requested.

## 2019-04-04 ENCOUNTER — Telehealth: Payer: Self-pay

## 2019-04-04 NOTE — Telephone Encounter (Signed)
TC from pt requesting referral to fertility Clinic pt states she was told to call when she was ready for appt and referral would be sent please advise pt awaiting response.

## 2019-04-04 NOTE — Telephone Encounter (Signed)
TC from pt requesting referral to fertility Clinic pt states she was told to call when she was ready for appt and referral would be sent please advise pt awaiting response.  

## 2019-04-09 ENCOUNTER — Other Ambulatory Visit: Payer: Self-pay | Admitting: Obstetrics

## 2019-04-09 DIAGNOSIS — N979 Female infertility, unspecified: Secondary | ICD-10-CM

## 2019-12-16 ENCOUNTER — Inpatient Hospital Stay (HOSPITAL_COMMUNITY)
Admission: AD | Admit: 2019-12-16 | Discharge: 2019-12-16 | Disposition: A | Discharge status: Home / Self Care | Payer: Managed Care, Other (non HMO) | Attending: Obstetrics and Gynecology | Admitting: Obstetrics and Gynecology

## 2019-12-16 ENCOUNTER — Encounter (HOSPITAL_COMMUNITY): Payer: Self-pay | Admitting: Emergency Medicine

## 2019-12-16 ENCOUNTER — Other Ambulatory Visit: Payer: Self-pay

## 2019-12-16 ENCOUNTER — Inpatient Hospital Stay (HOSPITAL_COMMUNITY): Payer: Managed Care, Other (non HMO)

## 2019-12-16 DIAGNOSIS — Z6790 Unspecified blood type, Rh positive: Secondary | ICD-10-CM | POA: Diagnosis not present

## 2019-12-16 DIAGNOSIS — O3680X0 Pregnancy with inconclusive fetal viability, not applicable or unspecified: Secondary | ICD-10-CM | POA: Insufficient documentation

## 2019-12-16 DIAGNOSIS — O209 Hemorrhage in early pregnancy, unspecified: Secondary | ICD-10-CM | POA: Insufficient documentation

## 2019-12-16 DIAGNOSIS — O4690 Antepartum hemorrhage, unspecified, unspecified trimester: Secondary | ICD-10-CM

## 2019-12-16 DIAGNOSIS — O26891 Other specified pregnancy related conditions, first trimester: Secondary | ICD-10-CM | POA: Diagnosis not present

## 2019-12-16 DIAGNOSIS — Z79899 Other long term (current) drug therapy: Secondary | ICD-10-CM | POA: Insufficient documentation

## 2019-12-16 DIAGNOSIS — Z3A01 Less than 8 weeks gestation of pregnancy: Secondary | ICD-10-CM | POA: Diagnosis not present

## 2019-12-16 LAB — URINALYSIS, ROUTINE W REFLEX MICROSCOPIC
Bilirubin Urine: NEGATIVE
Glucose, UA: NEGATIVE mg/dL
Ketones, ur: NEGATIVE mg/dL
Leukocytes,Ua: NEGATIVE
Nitrite: NEGATIVE
Protein, ur: NEGATIVE mg/dL
Specific Gravity, Urine: 1.008 (ref 1.005–1.030)
pH: 6 (ref 5.0–8.0)

## 2019-12-16 LAB — COMPREHENSIVE METABOLIC PANEL
ALT: 20 U/L (ref 0–44)
AST: 21 U/L (ref 15–41)
Albumin: 4.2 g/dL (ref 3.5–5.0)
Alkaline Phosphatase: 53 U/L (ref 38–126)
Anion gap: 9 (ref 5–15)
BUN: 7 mg/dL (ref 6–20)
CO2: 25 mmol/L (ref 22–32)
Calcium: 9.5 mg/dL (ref 8.9–10.3)
Chloride: 105 mmol/L (ref 98–111)
Creatinine, Ser: 0.94 mg/dL (ref 0.44–1.00)
GFR calc Af Amer: >60 mL/min (ref >60)
GFR calc non Af Amer: >60 mL/min (ref >60)
Glucose, Bld: 96 mg/dL (ref 70–99)
Potassium: 3.9 mmol/L (ref 3.5–5.1)
Sodium: 139 mmol/L (ref 135–145)
Total Bilirubin: 0.6 mg/dL (ref 0.3–1.2)
Total Protein: 7.4 g/dL (ref 6.5–8.1)

## 2019-12-16 LAB — CBC
HCT: 39 % (ref 36.0–46.0)
Hemoglobin: 13 g/dL (ref 12.0–15.0)
MCH: 31.3 pg (ref 26.0–34.0)
MCHC: 33.3 g/dL (ref 30.0–36.0)
MCV: 94 fL (ref 80.0–100.0)
Platelets: 220 10*3/uL (ref 150–400)
RBC: 4.15 MIL/uL (ref 3.87–5.11)
RDW: 12.4 % (ref 11.5–15.5)
WBC: 6.6 10*3/uL (ref 4.0–10.5)
nRBC: 0 % (ref 0.0–0.2)

## 2019-12-16 LAB — WET PREP, GENITAL
Sperm: NONE SEEN
Trich, Wet Prep: NONE SEEN
Yeast Wet Prep HPF POC: NONE SEEN

## 2019-12-16 LAB — HCG, QUANTITATIVE, PREGNANCY: hCG, Beta Chain, Quant, S: 62 m[IU]/mL — ABNORMAL HIGH (ref <5)

## 2019-12-16 LAB — POCT PREGNANCY, URINE: Preg Test, Ur: POSITIVE — AB

## 2019-12-16 NOTE — ED Provider Notes (Signed)
Patient placed in Quick Look pathway, seen and evaluated   Chief Complaint: Vaginal bleeding/cramping, positive pregnancy test  HPI: Patient reports last menstrual period was the first week of January.  Since that time she has had 2+ home pregnancy tests.  She reports she is feeling well this morning until around noon-1 PM she began having, "light vaginal spotting" which has gradually increased for the past 3 hours.  She reports that over the last hour she has developed some mild pelvic cramping as well.  She has no other complaints or concerns.  ROS: Denies fever/chills, chest pain/shortness of breath, upper abdominal pain, nausea/vomiting, diarrhea, fall/injury or any additional concerns.  Physical Exam:   Gen: Resting comfortably, no acute distress  Neuro: Awake and Alert  Skin: Warm    Focused Exam: Abdomen soft minimal lower abdominal tenderness, no rebound guarding or peritoneal signs. ------------------ MDM: Patient with 2 home positive pregnancy test reports today for vaginal bleeding and pelvic cramping.  She is well appearing, sitting comfortably in chair and in no acute distress vital signs within normal limits.  Feel patient is stable for transfer of [facility] for concern of pregnancy related vaginal bleeding and cramping.  4:00 PM: I discussed the case with [facility] provider [place] who accepts patient in transfer.  Informed RN Seward Grater of need for transfer.  Initiation of care has begun. The patient has been counseled on the process, plan, and necessity for staying for the completion/evaluation, and the remainder of the medical screening examination   Elizabeth Palau 12/16/19 1605    Maia Plan, MD 12/18/19 [id number]

## 2019-12-16 NOTE — ED Triage Notes (Signed)
Pt reports having 2 positive pregnancy tests yesterday but began having vaginal bleeding today around 1. Endorses abd cramping.

## 2019-12-16 NOTE — Discharge Instructions (Signed)
Threatened Miscarriage  A threatened miscarriage occurs when a woman has vaginal bleeding during the first 20 weeks of pregnancy but the pregnancy has not ended. If you have vaginal bleeding during this time, your health care provider will do tests to make sure you are still pregnant. If the tests show that you are still pregnant and that the developing baby (fetus) inside your uterus is still growing, your condition is considered a threatened miscarriage. A threatened miscarriage does not mean your pregnancy will end, but it does increase the risk of losing your pregnancy (complete miscarriage). What are the causes? The cause of this condition is usually not known. For women who go on to have a complete miscarriage, the most common cause is an abnormal number of chromosomes in the developing baby. Chromosomes are the structures inside cells that hold all of a person's genetic material. What increases the risk? The following lifestyle factors may increase your risk of a miscarriage in early pregnancy:  Smoking.  Drinking excessive amounts of alcohol or caffeine.  Recreational drug use. The following preexisting health conditions may increase your risk of a miscarriage in early pregnancy:  Polycystic ovary syndrome.  Uterine fibroids.  Infections.  Diabetes mellitus. What are the signs or symptoms? Symptoms of this condition include:  Vaginal bleeding.  Mild abdominal pain or cramps. How is this diagnosed? If you have bleeding with or without abdominal pain before 20 weeks of pregnancy, your health care provider will do tests to check whether you are still pregnant. These will include:  Ultrasound. This test uses sound waves to create images of the inside of your uterus. This allows your health care provider to look at your developing baby and other structures, such as your placenta.  Pelvic exam. This is an internal exam of your vagina and cervix.  Measurement of your baby's heart  rate.  Laboratory tests such as blood tests, urine tests, or swabs for infection You may be diagnosed with a threatened miscarriage if:  Ultrasound testing shows that you are still pregnant.  Your baby's heart rate is strong.  A pelvic exam shows that the opening between your uterus and your vagina (cervix) is closed.  Blood tests confirm that you are still pregnant. How is this treated? No treatments have been shown to prevent a threatened miscarriage from going on to a complete miscarriage. However, the right home care is important. Follow these instructions at home:  Get plenty of rest.  Do not have sex or use tampons if you have vaginal bleeding.  Do not douche.  Do not smoke or use recreational drugs.  Do not drink alcohol.  Avoid caffeine.  Keep all follow-up prenatal visits as told by your health care provider. This is important. Contact a health care provider if:  You have light vaginal bleeding or spotting while pregnant.  You have abdominal pain or cramping.  You have a fever. Get help right away if:  You have heavy vaginal bleeding.  You have blood clots coming from your vagina.  You pass tissue from your vagina.  You leak fluid, or you have a gush of fluid from your vagina.  You have severe low back pain or abdominal cramps.  You have fever, chills, and severe abdominal pain. Summary  A threatened miscarriage occurs when a woman has vaginal bleeding during the first 20 weeks of pregnancy but the pregnancy has not ended.  The cause of a threatened miscarriage is usually not known.  Symptoms of this condition may   include vaginal bleeding and mild abdominal pain or cramps.  No treatments have been shown to prevent a threatened miscarriage from going on to a complete miscarriage.  Keep all follow-up prenatal visits as told by your health care provider. This is important. This information is not intended to replace advice given to you by your health  care provider. Make sure you discuss any questions you have with your health care provider. Document Revised: 11/23/2017 Document Reviewed: 01/13/2017 Elsevier Patient Education  2020 [employer] Ectopic Pregnancy  An ectopic pregnancy is when the fertilized egg attaches (implants) outside the uterus. Most ectopic pregnancies occur in one of the tubes where eggs travel from the ovary to the uterus (fallopian tubes), but the implanting can occur in other locations. In rare cases, ectopic pregnancies occur on the ovary, intestine, pelvis, abdomen, or cervix. In an ectopic pregnancy, the fertilized egg does not have the ability to develop into a normal, healthy baby. A ruptured ectopic pregnancy is one in which tearing or bursting of a fallopian tube causes internal bleeding. Often, there is intense lower abdominal pain, and vaginal bleeding sometimes occurs. Having an ectopic pregnancy can be life-threatening. If this dangerous condition is not treated, it can lead to blood loss, shock, or even death. What are the causes? The most common cause of this condition is damage to one of the fallopian tubes. A fallopian tube may be narrowed or blocked, and that keeps the fertilized egg from reaching the uterus. What increases the risk? This condition is more likely to develop in women of childbearing age who have different levels of risk. The levels of risk can be divided into three categories. High risk  You have gone through infertility treatment.  You have had an ectopic pregnancy before.  You have had surgery on the fallopian tubes, or another surgical procedure, such as an abortion.  You have had surgery to have the fallopian tubes tied (tubal ligation).  You have problems or diseases of the fallopian tubes.  You have been exposed to diethylstilbestrol (DES). This medicine was used until 1971, and it had effects on babies whose mothers took the medicine.  You become pregnant while using an  IUD (intrauterine device) for birth control. Moderate risk  You have a history of infertility.  You have had an STI (sexually transmitted infection).  You have a history of pelvic inflammatory disease (PID).  You have scarring from endometriosis.  You have multiple sexual partners.  You smoke. Low risk  You have had pelvic surgery.  You use vaginal douches.  You became sexually active before age 71. What are the signs or symptoms? Common symptoms of this condition include normal pregnancy symptoms, such as missing a period, nausea, tiredness, abdominal pain, breast tenderness, and bleeding. However, ectopic pregnancy will have additional symptoms, such as:  Pain with intercourse.  Irregular vaginal bleeding or spotting.  Cramping or pain on one side or in the lower abdomen.  Fast heartbeat, low blood pressure, and sweating.  Passing out while having a bowel movement. Symptoms of a ruptured ectopic pregnancy and internal bleeding may include:  Sudden, severe pain in the abdomen and pelvis.  Dizziness, weakness, light-headedness, or fainting.  Pain in the shoulder or neck area. How is this diagnosed? This condition is diagnosed by:  A pelvic exam to locate pain or a mass in the abdomen.  A pregnancy test. This blood test checks for the presence as well as the specific level of pregnancy hormone in the bloodstream.  Ultrasound. This is performed if a pregnancy test is positive. In this test, a probe is inserted into the vagina. The probe will detect a fetus, possibly in a location other than the uterus.  Taking a sample of uterus tissue (dilation and curettage, or D&C).  Surgery to perform a visual exam of the inside of the abdomen using a thin, lighted tube that has a tiny camera on the end (laparoscope).  Culdocentesis. This procedure involves inserting a needle at the top of the vagina, behind the uterus. If blood is present in this area, it may indicate that a  fallopian tube is torn. How is this treated? This condition is treated with medicine or surgery. Medicine  An injection of a medicine (methotrexate) may be given to cause the pregnancy tissue to be absorbed. This medicine may save your fallopian tube. It may be given if: ? The diagnosis is made early, with no signs of active bleeding. ? The fallopian tube has not ruptured. ? You are considered to be a good candidate for the medicine. Usually, pregnancy hormone blood levels are checked after methotrexate treatment. This is to be sure that the medicine is effective. It may take 4-6 weeks for the pregnancy to be absorbed. Most pregnancies will be absorbed by 3 weeks. Surgery  A laparoscope may be used to remove the pregnancy tissue.  If severe internal bleeding occurs, a larger cut (incision) may be made in the lower abdomen (laparotomy) to remove the fetus and placenta. This is done to stop the bleeding.  Part or all of the fallopian tube may be removed (salpingectomy) along with the fetus and placenta. The fallopian tube may also be repaired during the surgery.  In very rare circumstances, removal of the uterus (hysterectomy) may be required.  After surgery, pregnancy hormone testing may be done to be sure that there is no pregnancy tissue left. Whether your treatment is medicine or surgery, you may receive a Rho (D) immune globulin shot to prevent problems with any future pregnancy. This shot may be given if:  You are Rh-negative and the baby's father is Rh-positive.  You are Rh-negative and you do not know the Rh type of the baby's father. Follow these instructions at home:  Rest and limit your activity after the procedure for as long as told by your health care provider.  Until your health care provider says that it is safe: ? Do not lift anything that is heavier than 10 lb (4.5 kg), or the limit that your health care provider tells you. ? Avoid physical exercise and any movement  that requires effort (is strenuous).  To help prevent constipation: ? Eat a healthy diet that includes fruits, vegetables, and whole grains. ? Drink 6-8 glasses of water per day. Get help right away if:  You develop worsening pain that is not relieved by medicine.  You have: ? A fever or chills. ? Vaginal bleeding. ? Redness and swelling at the incision site. ? Nausea and vomiting.  You feel dizzy or weak.  You feel light-headed or you faint. This information is not intended to replace advice given to you by your health care provider. Make sure you discuss any questions you have with your health care provider. Document Revised: 09/29/2017 Document Reviewed: 05/18/2016 Elsevier Patient Education  [place] Medications in Pregnancy    Acne: Benzoyl Peroxide Salicylic Acid  Backache/Headache: Tylenol: 2 regular strength every 4 hours OR              2 Extra strength every 6 hours  Colds/Coughs/Allergies: Benadryl (alcohol free) 25 mg every 6 hours as needed Breath right strips Claritin Cepacol throat lozenges Chloraseptic throat spray Cold-Eeze- up to three times per day Cough drops, alcohol free Flonase (by prescription only) Guaifenesin Mucinex Robitussin DM (plain only, alcohol free) Saline nasal spray/drops Sudafed (pseudoephedrine) & Actifed ** use only after [redacted] weeks gestation and if you do not have high blood pressure Tylenol Vicks Vaporub Zinc lozenges Zyrtec   Constipation: Colace Ducolax suppositories Fleet enema Glycerin suppositories Metamucil Milk of magnesia Miralax Senokot Smooth move tea  Diarrhea: Kaopectate Imodium A-D  *NO pepto Bismol  Hemorrhoids: Anusol Anusol HC Preparation H Tucks  Indigestion: Tums Maalox Mylanta Zantac  Pepcid  Insomnia: Benadryl (alcohol free) 25mg  every 6 hours as needed Tylenol PM Unisom, no Gelcaps  Leg Cramps: Tums MagGel  Nausea/Vomiting:   Bonine Dramamine Emetrol Ginger extract Sea bands Meclizine  Nausea medication to take during pregnancy:  Unisom (doxylamine succinate 25 mg tablets) Take one tablet daily at bedtime. If symptoms are not adequately controlled, the dose can be increased to a maximum recommended dose of two tablets daily (1/2 tablet in the morning, 1/2 tablet mid-afternoon and one at bedtime). Vitamin B6 100mg  tablets. Take one tablet twice a day (up to 200 mg per day).  Skin Rashes: Aveeno products Benadryl cream or 25mg  every 6 hours as needed Calamine Lotion 1% cortisone cream  Yeast infection: Gyne-lotrimin 7 Monistat 7   **If taking multiple medications, please check labels to avoid duplicating the same active ingredients **take medication as directed on the label ** Do not exceed 4000 mg of tylenol in 24 hours **Do not take medications that contain aspirin or ibuprofen

## 2019-12-16 NOTE — MAU Provider Note (Signed)
History     CSN: [id number]  Arrival date and time: 12/16/19 1526   First Provider Initiated Contact with Patient 12/16/19 1824      Chief Complaint  Patient presents with  . Vaginal Bleeding  . Abdominal Pain   Ms. Rand Boller is a 29 y.o. G2P1001 at [redacted]w[redacted]d who presents to MAU for vaginal bleeding which began around 12noon. Pt describes the bleeding as brown spotting at first, that is now red spotting that continued when she arrived to MAU.  Passing blood clots? no Blood soaking clothes? no Lightheaded/dizzy? no Significant pelvic pain or cramping? no Passed any tissue? no Hx ectopic pregnancy? no  Current pregnancy problems? pt has not yet been seen Blood Type? B Positive Allergies? NKDA Current medications? none Current PNC & next appt? none  Pt denies vaginal discharge/odor/itching. Pt denies N/V, abdominal pain, constipation, diarrhea, or urinary problems. Pt denies fever, chills, fatigue, sweating or changes in appetite. Pt denies SOB or chest pain. Pt denies dizziness, HA, light-headedness, weakness.   OB History    Gravida  2   Para  1   Term  1   Preterm      AB      Living  1     SAB      TAB      Ectopic      Multiple  0   Live Births  1           History reviewed. No pertinent past medical history.  Past Surgical History:  Procedure Laterality Date  . NO PAST SURGERIES      History reviewed. No pertinent family history.  Social History   Tobacco Use  . Smoking status: Never Smoker  . Smokeless tobacco: Never Used  Substance Use Topics  . Alcohol use: No    Alcohol/week: 0.0 standard drinks  . Drug use: No    Allergies: No Known Allergies  Medications Prior to Admission  Medication Sig Dispense Refill Last Dose  . acetaminophen (TYLENOL) 500 MG tablet Take 1,000 mg by mouth every 6 (six) hours as needed for moderate pain.     . Prenat-FeAsp-Meth-FA-DHA w/o A (PRENATE PIXIE) 10-0.6-0.4-200 MG CAPS Take 1 capsule by  mouth daily before breakfast. (Patient not taking: Reported on 06/05/2018) 30 capsule 11   . tinidazole (TINDAMAX) 500 MG tablet Take 2 tablets (1,000 mg total) by mouth daily with breakfast. 10 tablet 2     Review of Systems  Constitutional: Negative for chills, diaphoresis, fatigue and fever.  Eyes: Negative for visual disturbance.  Respiratory: Negative for shortness of breath.   Cardiovascular: Negative for chest pain.  Gastrointestinal: Negative for abdominal pain, constipation, diarrhea, nausea and vomiting.  Genitourinary: Positive for vaginal bleeding. Negative for dysuria, flank pain, frequency, pelvic pain, urgency and vaginal discharge.  Neurological: Negative for dizziness, weakness, light-headedness and headaches.   Physical Exam   Blood pressure 126/83, pulse 68, temperature 98.7 F (37.1 C), temperature source Oral, resp. rate 16, height 5' 5.5" (1.664 m), weight 57.4 kg, last menstrual period 11/16/2019, SpO2 100 %.  Patient Vitals for the past 24 hrs:  BP Temp Temp src Pulse Resp SpO2 Height Weight  12/16/19 1710 126/83 98.7 F (37.1 C) Oral 68 16 100 % -- --  12/16/19 1707 -- -- -- -- -- -- 5' 5.5" (1.664 m) 57.4 kg  12/16/19 1550 130/90 98.5 F (36.9 C) Oral 75 20 99 % -- --  ' Physical Exam  Constitutional: She is oriented to  person, place, and time. She appears well-developed and well-nourished. No distress.  HENT:  Head: Normocephalic and atraumatic.  Respiratory: Effort normal.  GI: Soft. She exhibits no distension and no mass. There is no abdominal tenderness. There is no rebound and no guarding.  Genitourinary: There is no rash, tenderness or lesion on the right labia. There is no rash, tenderness or lesion on the left labia. Uterus is not enlarged and not tender. Cervix exhibits no motion tenderness, no discharge and no friability. Right adnexum displays no mass, no tenderness and no fullness. Left adnexum displays no mass, no tenderness and no fullness.     Vaginal bleeding (minimal bleeding present in vaginal vault, minimal active bleding coming from external os.) present.     No vaginal discharge or tenderness.  There is bleeding (minimal bleeding present in vaginal vault, minimal active bleding coming from external os.) in the vagina. No tenderness in the vagina.    Genitourinary Comments: Cervical os appears closed, closed on exam.   Neurological: She is alert and oriented to person, place, and time.  Skin: Skin is warm and dry. She is not diaphoretic.  Psychiatric: She has a normal mood and affect. Her behavior is normal. Judgment and thought content normal.   Results for orders placed or performed during the hospital encounter of 12/16/19 (from the past 24 hour(s))  Urinalysis, Routine w reflex microscopic     Status: Abnormal   Collection Time: 12/16/19  4:49 PM  Result Value Ref Range   Color, Urine STRAW (A) YELLOW   APPearance CLEAR CLEAR   Specific Gravity, Urine 1.008 1.005 - 1.030   pH 6.0 5.0 - 8.0   Glucose, UA NEGATIVE NEGATIVE mg/dL   Hgb urine dipstick MODERATE (A) NEGATIVE   Bilirubin Urine NEGATIVE NEGATIVE   Ketones, ur NEGATIVE NEGATIVE mg/dL   Protein, ur NEGATIVE NEGATIVE mg/dL   Nitrite NEGATIVE NEGATIVE   Leukocytes,Ua NEGATIVE NEGATIVE   RBC / HPF 0-5 0 - 5 RBC/hpf   WBC, UA 0-5 0 - 5 WBC/hpf   Bacteria, UA FEW (A) NONE SEEN   Squamous Epithelial / LPF 0-5 0 - 5   Mucus PRESENT   Pregnancy, urine POC     Status: Abnormal   Collection Time: 12/16/19  4:50 PM  Result Value Ref Range   Preg Test, Ur POSITIVE (A) NEGATIVE  CBC     Status: None   Collection Time: 12/16/19  5:50 PM  Result Value Ref Range   WBC 6.6 4.0 - 10.5 K/uL   RBC 4.15 3.87 - 5.11 MIL/uL   Hemoglobin 13.0 12.0 - 15.0 g/dL   HCT 85.6 31.4 - 97.0 %   MCV 94.0 80.0 - 100.0 fL   MCH 31.3 26.0 - 34.0 pg   MCHC 33.3 30.0 - 36.0 g/dL   RDW 26.3 78.5 - 88.5 %   Platelets 220 150 - 400 K/uL   nRBC 0.0 0.0 - 0.2 %  Comprehensive metabolic  panel     Status: None   Collection Time: 12/16/19  5:50 PM  Result Value Ref Range   Sodium 139 135 - 145 mmol/L   Potassium 3.9 3.5 - 5.1 mmol/L   Chloride 105 98 - 111 mmol/L   CO2 25 22 - 32 mmol/L   Glucose, Bld 96 70 - 99 mg/dL   BUN 7 6 - 20 mg/dL   Creatinine, Ser 0.27 0.44 - 1.00 mg/dL   Calcium 9.5 8.9 - 74.1 mg/dL   Total Protein 7.4 6.5 -  8.1 g/dL   Albumin 4.2 3.5 - 5.0 g/dL   AST 21 15 - 41 U/L   ALT 20 0 - 44 U/L   Alkaline Phosphatase 53 38 - 126 U/L   Total Bilirubin 0.6 0.3 - 1.2 mg/dL   GFR calc non Af Amer >60 >60 mL/min   GFR calc Af Amer >60 >60 mL/min   Anion gap 9 5 - 15  ABO/Rh     Status: None   Collection Time: 12/16/19  5:50 PM  Result Value Ref Range   ABO/RH(D)      B POS Performed at [facility], [address] [place], [place] [address]   hCG, quantitative, pregnancy     Status: Abnormal   Collection Time: 12/16/19  5:50 PM  Result Value Ref Range   hCG, Beta Chain, Quant, S 62 (H) <5 mIU/mL   US OB LESS THAN 14 WEEKS WITH OB TRANSVAGINAL  Result Date: 12/16/2019 CLINICAL DATA:  Vaginal bleeding that began today, cramping EXAM: OBSTETRIC <14 WK ULTRASOUND TECHNIQUE: Transabdominal ultrasound was performed for evaluation of the gestation as well as the maternal uterus and adnexal regions. COMPARISON:  None. FINDINGS: Intrauterine gestational sac: None Yolk sac:  Not Visualized. Embryo:  Not Visualized. Maternal uterus/adnexae: Uterus is anteverted. Minimal fluid within the endometrial cavity. Endometrium measures up to 8 mm in thickness. Left ovary measures 3.5 x 2.7 x 1.8 cm in the right ovary measures 3.5 x 1.2 x 1.2 cm. Both ovaries demonstrate normal color flow. IMPRESSION: 1. No evidence of intrauterine pregnancy at this time. Serial beta HCG measurements and follow-up ultrasound may be needed to document a live intrauterine pregnancy. 2. Otherwise unremarkable exam. Electronically Signed   By: Sharlet Salina M.D.   On: 12/16/2019 19:47    MAU Course  Procedures  MDM -r/o ectopic -UA: straw/mod hgb/few bacteria, sending urine for culture -CBC: WNL -CMP: WNL -[place]: pregnancy of unknown anatomic location -hCG: 62 -ABO: B Positive -WetPrep: pending at time of discharge -GC/CT collected -pt discharged to home in stable condition  Orders Placed This Encounter  Procedures  . Wet prep, genital    Standing Status:   Standing    Number of Occurrences:   1  . Culture, OB Urine    Standing Status:   Standing    Number of Occurrences:   1  . US OB LESS THAN 14 WEEKS WITH OB TRANSVAGINAL    Standing Status:   Standing    Number of Occurrences:   1    Order Specific Question:   Symptom/Reason for Exam    Answer:   Vaginal bleeding in pregnancy [705036]  . Urinalysis, Routine w reflex microscopic    Standing Status:   Standing    Number of Occurrences:   1  . CBC    Standing Status:   Standing    Number of Occurrences:   1  . Comprehensive metabolic panel    Standing Status:   Standing    Number of Occurrences:   1  . hCG, quantitative, pregnancy    Standing Status:   Standing    Number of Occurrences:   1  . Contact ([place]) / Contact Isolation: Enteric    Standing Status:   Standing    Number of Occurrences:   1  . Pregnancy, urine POC    Standing Status:   Standing    Number of Occurrences:   1  . ABO/Rh    Standing Status:   Standing  Number of Occurrences:   1  . Discharge patient    Order Specific Question:   Discharge disposition    Answer:   01-Home or Self Care [1]    Order Specific Question:   Discharge patient date    Answer:   12/16/2019   Assessment and Plan   1. Pregnancy of unknown anatomic location   2. Vaginal bleeding in pregnancy   3. Blood type, Rh positive    Allergies as of 12/16/2019   No Known Allergies     Medication List    TAKE these medications   acetaminophen 500 MG tablet Commonly known as: TYLENOL Take 1,000 mg by mouth every 6 (six) hours as needed for moderate  pain.   Prenate Pixie 10-0.6-0.4-200 MG Caps Take 1 capsule by mouth daily before breakfast.   tinidazole 500 MG tablet Commonly known as: Tindamax Take 2 tablets (1,000 mg total) by mouth daily with breakfast.      -will call with culture results, if positive -discussed early pregnancy vs. SAB vs. ectopic differentials -appt scheduled @[facility]  for 0830AM on Thursday 12/19/2019 -strict ectopic/bleeding/pain/return MAU precautions given -pt discharged to home in stable condition   Elmyra Ricks E Carnel Stegman 12/16/2019, 8:23 PM

## 2019-12-16 NOTE — MAU Note (Signed)
Samantha Liu is a 29 y.o. at [redacted]w[redacted]d here in MAU reporting: + pregnancy test yesterday. Around 12 today saw some brown spotting. Then started cramping and then started having pink bleeding. Saw the bleeding in her panties but is not wearing a pad.  LMP: 11/16/19  Onset of complaint: today  Pain score: 4/10  Vitals:   12/16/19 1550 12/16/19 1710  BP: 130/90 126/83  Pulse: 75 68  Resp: 20 16  Temp: 98.5 F (36.9 C) 98.7 F (37.1 C)  SpO2: 99% 100%     Lab orders placed from triage: UA, UPT

## 2019-12-17 LAB — ABO/RH: ABO/RH(D): B POS

## 2019-12-18 ENCOUNTER — Telehealth: Payer: Self-pay | Admitting: Family Medicine

## 2019-12-18 ENCOUNTER — Telehealth: Payer: Self-pay

## 2019-12-18 ENCOUNTER — Other Ambulatory Visit: Payer: Self-pay

## 2019-12-18 LAB — GC/CHLAMYDIA PROBE AMP (~~LOC~~) NOT AT ARMC
Chlamydia: NEGATIVE
Comment: NEGATIVE
Comment: NORMAL
Neisseria Gonorrhea: NEGATIVE

## 2019-12-18 LAB — CULTURE, OB URINE: Culture: 70000 — AB

## 2019-12-18 NOTE — Progress Notes (Unsigned)
Pt not able to have labs done on Friday due to work schedule. I consulted with Sherrlyn Hock CNM she state

## 2019-12-18 NOTE — Telephone Encounter (Signed)
TC to pt to make aware importance of HCG lab appt Pt voiced understanding and stated she may be having a SAB  and can come in on Monday. Pt made aware scheduling will be contacting her today.

## 2019-12-18 NOTE — Telephone Encounter (Signed)
Spoke with patient about canceling her appointment due to the weather being bad. She wanted to know what the appointment was for. When I explained to her it was a follow up from MAU visit to get some blood work done, she stated she already had blood work. She stated she could not come in on Friday because she has to work. She asked was anything wrong. I informed her she should call the [facility] office if she has any questions because she has been seeing Dr. Clearance Coots. She stated she would, and thanked me for calling her.

## 2019-12-19 ENCOUNTER — Other Ambulatory Visit: Payer: Managed Care, Other (non HMO)

## 2019-12-19 ENCOUNTER — Telehealth: Payer: Self-pay | Admitting: Advanced Practice Midwife

## 2019-12-19 NOTE — Telephone Encounter (Signed)
I called Samantha Liu since she cancelled her stat hcg visit at [facility] due to weather. She is scheduled for hcg on Monday at [facility] at this time.  Pt denies any pain and reports her bleeding has increased to light menstrual bleeding, requiring a pad. Her hcg was 62 on 12/16/19 and she reports a positive pregnancy test at home prior to coming to [facility]U.  Given full clinical picture, this is likely a miscarriage but pt given ectopic precautions, and encouraged to come to [facility]U if she develops abdominal pain so we can draw a stat hcg before Monday. If she feels well, the pt will keep her appt at [facility] in Monday morning.

## 2019-12-23 ENCOUNTER — Other Ambulatory Visit: Payer: Managed Care, Other (non HMO)

## 2019-12-23 ENCOUNTER — Telehealth: Payer: Self-pay

## 2019-12-23 ENCOUNTER — Other Ambulatory Visit: Payer: Self-pay

## 2019-12-23 DIAGNOSIS — O4690 Antepartum hemorrhage, unspecified, unspecified trimester: Secondary | ICD-10-CM

## 2019-12-23 LAB — BETA HCG QUANT (REF LAB): hCG Quant: 5 m[IU]/mL

## 2019-12-23 NOTE — Telephone Encounter (Signed)
[facility] called and reports stat HCG from this morning is 5. Pt made aware and reports she is no longer bleeding. Please advise on next steps for patient.

## 2019-12-24 ENCOUNTER — Telehealth: Payer: Self-pay | Admitting: Women's Health

## 2019-12-24 DIAGNOSIS — N3000 Acute cystitis without hematuria: Secondary | ICD-10-CM

## 2019-12-24 MED ORDER — CEFADROXIL 500 MG PO CAPS: 500.0000 mg | ORAL_CAPSULE | Freq: Two times a day (BID) | ORAL | 0 refills | Status: AC

## 2019-12-24 NOTE — Telephone Encounter (Signed)
Pt called to make aware of urinary tract infection on MAU visit. Pt reports NKDA, no current medication and no medical problems. RX sent. Pt verbalizes understanding and questions answered to patient satisfaction.  Mieko Kneebone, Odie Sera, NP  9:22 AM 12/24/2019

## 2020-07-29 ENCOUNTER — Encounter (HOSPITAL_COMMUNITY): Payer: Self-pay | Admitting: Obstetrics and Gynecology

## 2020-07-29 ENCOUNTER — Inpatient Hospital Stay (HOSPITAL_COMMUNITY)
Admission: AD | Admit: 2020-07-29 | Discharge: 2020-07-29 | Disposition: A | Discharge status: Home / Self Care | Payer: Managed Care, Other (non HMO) | Attending: Obstetrics and Gynecology | Admitting: Obstetrics and Gynecology

## 2020-07-29 ENCOUNTER — Other Ambulatory Visit: Payer: Self-pay

## 2020-07-29 DIAGNOSIS — N912 Amenorrhea, unspecified: Secondary | ICD-10-CM | POA: Diagnosis not present

## 2020-07-29 NOTE — MAU Provider Note (Addendum)
Ms.Baani Bubel is a 29 y.o. G2P1011 at Unknown who presents to [facility]U today for pregnancy verification. The patient denies abdominal pain or vaginal bleeding today.   BP 116/75   Pulse 82   Temp 98.4 F (36.9 C)   Resp 18   Ht 5\' 5"  (1.651 m)   Wt 54.1 kg   SpO2 97%   Breastfeeding Unknown   BMI 19.85 kg/m   CONSTITUTIONAL: Well-developed, well-nourished female in no acute distress.  MUSCULOSKELETAL: Normal range of motion.  CARDIOVASCULAR: Regular heart rate RESPIRATORY: Normal effort NEUROLOGICAL: Alert and oriented to person, place, and time.  SKIN: No pallor. PSYCH: Normal mood and affect. Normal behavior. Normal judgment and thought content.  MDM Patient advised that without concerning symptoms today UPT will not be performed in MAU at this time  A: Amenorrhea  P: Discharge home Pt advised that routine pregnancy tests are offered all [facility] offices-list provided Patient may return to MAU as needed or if her condition were to change or worsen   [facility], Donette Larry  07/29/2020 11:39 AM

## 2020-07-29 NOTE — MAU Note (Signed)
Pt had miscarriage/ectopic pregnancy in march . Missed her cycle 4 days ago and had 2 positive HPT. Denies any pain or vaginal bleeding just needs confirmation,

## 2020-08-03 ENCOUNTER — Ambulatory Visit (INDEPENDENT_AMBULATORY_CARE_PROVIDER_SITE_OTHER): Payer: Managed Care, Other (non HMO)

## 2020-08-03 ENCOUNTER — Other Ambulatory Visit: Payer: Self-pay

## 2020-08-03 DIAGNOSIS — Z3A08 8 weeks gestation of pregnancy: Secondary | ICD-10-CM

## 2020-08-03 DIAGNOSIS — O3680X1 Pregnancy with inconclusive fetal viability, fetus 1: Secondary | ICD-10-CM | POA: Diagnosis not present

## 2020-08-03 DIAGNOSIS — Z3200 Encounter for pregnancy test, result unknown: Secondary | ICD-10-CM

## 2020-08-03 DIAGNOSIS — Z789 Other specified health status: Secondary | ICD-10-CM

## 2020-08-03 DIAGNOSIS — O3680X0 Pregnancy with inconclusive fetal viability, not applicable or unspecified: Secondary | ICD-10-CM

## 2020-08-03 DIAGNOSIS — Z3480 Encounter for supervision of other normal pregnancy, unspecified trimester: Secondary | ICD-10-CM | POA: Insufficient documentation

## 2020-08-03 DIAGNOSIS — Z3491 Encounter for supervision of normal pregnancy, unspecified, first trimester: Secondary | ICD-10-CM

## 2020-08-03 LAB — POCT URINE PREGNANCY: Preg Test, Ur: POSITIVE — AB

## 2020-08-03 MED ORDER — BLOOD PRESSURE KIT KIT: 1.0000 | PACK | 0 refills | Status: DC

## 2020-08-03 MED ORDER — BLOOD PRESSURE KIT DEVI: 1.0000 | 0 refills | Status: DC | PRN

## 2020-08-03 NOTE — Progress Notes (Signed)
Patient was assessed and managed by nursing staff during this encounter. I have reviewed the chart and agree with the documentation and plan. I have also made any necessary editorial changes.  Catalina Antigua, MD 08/03/2020 1:25 PM

## 2020-08-03 NOTE — Progress Notes (Signed)
..     I connected with Samantha Liu on 08/03/20 at 10:40 AM EDT by telephone and verified that I am speaking with the correct person using two identifiers.  Location: [facility] Patient: Samantha Liu Provider: Nurse   I discussed the limitations, risks, security and privacy concerns of performing an evaluation and management service by telephone and the availability of in person appointments. I also discussed with the patient that there may be a patient responsible charge related to this service. The patient expressed understanding and agreed to proceed.   History of Present Illness: PRENATAL INTAKE SUMMARY  Samantha Liu presents today New OB Nurse Interview.  OB History    Gravida  3   Para  1   Term  1   Preterm      AB  1   Living  1     SAB  1   TAB      Ectopic      Multiple  0   Live Births  1          I have reviewed the patient's medical, obstetrical, social, and family histories, medications, and available lab results.  SUBJECTIVE She has no unusual complaints   Observations/Objective: Initial nurse interview for history/labs (New OB)  EDD: 03-13-20 GA: 8A4Z GP: G3P1  GENERAL APPEARANCE: alert, well appearing  Assessment and Plan: Normal pregnancy Pt denies history of pre-existing conditions and surgeries. [facility], or pt will purchase her own. U/S performed today  Follow Up Instructions:   I discussed the assessment and treatment plan with the patient. The patient was provided an opportunity to ask questions and all were answered. The patient agreed with the plan and demonstrated an understanding of the instructions.   The patient was advised to call back or seek an in-person evaluation if the symptoms worsen or if the condition fails to improve as anticipated.  I provided 15 minutes of non-face-to-face time during this encounter.   Katrina Stack, RN

## 2020-08-03 NOTE — Progress Notes (Signed)
U/S performed today:  Reveals possible early gestational sac.  Results reviewed with Dr. Jolayne Panther  Per Dr. Jolayne Panther have patient to return in 2-3 weeks for follow up US.  Bleeding and pain precautions reviewed with patient.  Patient verbalized understanding.

## 2020-08-05 ENCOUNTER — Other Ambulatory Visit: Payer: Self-pay

## 2020-08-05 DIAGNOSIS — Z3491 Encounter for supervision of normal pregnancy, unspecified, first trimester: Secondary | ICD-10-CM

## 2020-08-05 MED ORDER — PREPLUS 27-1 MG PO TABS: 1.0000 | ORAL_TABLET | Freq: Every day | ORAL | 13 refills | Status: DC

## 2020-08-14 LAB — OB RESULTS CONSOLE GC/CHLAMYDIA
Chlamydia: NEGATIVE
Gonorrhea: NEGATIVE

## 2020-08-17 LAB — OB RESULTS CONSOLE PLATELET COUNT: Platelets: 220

## 2020-08-17 LAB — OB RESULTS CONSOLE ABO/RH: RH Type: POSITIVE

## 2020-08-17 LAB — OB RESULTS CONSOLE ANTIBODY SCREEN: Antibody Screen: NEGATIVE

## 2020-08-17 LAB — OB RESULTS CONSOLE HEPATITIS B SURFACE ANTIGEN: Hepatitis B Surface Ag: NEGATIVE

## 2020-08-17 LAB — OB RESULTS CONSOLE HGB/HCT, BLOOD
HCT: 40 (ref 29–41)
Hemoglobin: 13.3

## 2020-08-17 LAB — OB RESULTS CONSOLE HIV ANTIBODY (ROUTINE TESTING): HIV: NONREACTIVE

## 2020-08-17 LAB — HEPATITIS C ANTIBODY: HCV Ab: NEGATIVE

## 2020-08-17 LAB — OB RESULTS CONSOLE RUBELLA ANTIBODY, IGM: Rubella: IMMUNE

## 2020-08-17 LAB — OB RESULTS CONSOLE RPR: RPR: NONREACTIVE

## 2020-09-01 ENCOUNTER — Encounter: Payer: Managed Care, Other (non HMO) | Admitting: Advanced Practice Midwife

## 2020-09-17 ENCOUNTER — Encounter: Payer: Managed Care, Other (non HMO) | Admitting: Obstetrics

## 2020-10-07 ENCOUNTER — Ambulatory Visit (INDEPENDENT_AMBULATORY_CARE_PROVIDER_SITE_OTHER): Payer: Managed Care, Other (non HMO) | Admitting: Obstetrics

## 2020-10-07 ENCOUNTER — Other Ambulatory Visit: Payer: Self-pay

## 2020-10-07 ENCOUNTER — Encounter: Payer: Self-pay | Admitting: Obstetrics

## 2020-10-07 ENCOUNTER — Other Ambulatory Visit: Payer: Self-pay | Admitting: Obstetrics

## 2020-10-07 VITALS — BP 112/68 | HR 82 | Wt 127.0 lb

## 2020-10-07 DIAGNOSIS — Z3491 Encounter for supervision of normal pregnancy, unspecified, first trimester: Secondary | ICD-10-CM

## 2020-10-07 DIAGNOSIS — Z3480 Encounter for supervision of other normal pregnancy, unspecified trimester: Secondary | ICD-10-CM

## 2020-10-07 MED ORDER — VITAFOL ULTRA 29-0.6-0.4-200 MG PO CAPS: 1.0000 | ORAL_CAPSULE | Freq: Every day | ORAL | 4 refills | Status: DC

## 2020-10-07 NOTE — Progress Notes (Signed)
Subjective:    Samantha Liu is being seen today for her first obstetrical visit.  This is a planned pregnancy. She is at 104w0d gestation. Her obstetrical history is significant for none. Relationship with FOB: spouse, living together. Patient does intend to breast feed. Pregnancy history fully reviewed.  The information documented in the HPI was reviewed and verified.  Menstrual History: OB History    Gravida  3   Para  1   Term  1   Preterm      AB  1   Living  1     SAB  1   TAB      Ectopic      Multiple  0   Live Births  1            Patient's last menstrual period was 06/06/2020.    History reviewed. No pertinent past medical history.  Past Surgical History:  Procedure Laterality Date  . NO PAST SURGERIES      (Not in a hospital admission)  No Known Allergies  Social History   Tobacco Use  . Smoking status: Never Smoker  . Smokeless tobacco: Never Used  Substance Use Topics  . Alcohol use: No    Alcohol/week: 0.0 standard drinks    History reviewed. No pertinent family history.   Review of Systems Constitutional: negative for weight loss Gastrointestinal: negative for vomiting Genitourinary:negative for genital lesions and vaginal discharge and dysuria Musculoskeletal:negative for back pain Behavioral/Psych: negative for abusive relationship, depression, illegal drug usage and tobacco use    Objective:    BP 112/68   Pulse 82   Wt 127 lb (57.6 kg)   LMP 06/06/2020   BMI 21.13 kg/m  General Appearance:    Alert, cooperative, no distress, appears stated age  Head:    Normocephalic, without obvious abnormality, atraumatic  Eyes:    PERRL, conjunctiva/corneas clear, EOM's intact, fundi    benign, both eyes  Ears:    Normal TM's and external ear canals, both ears  Nose:   Nares normal, septum midline, mucosa normal, no drainage    or sinus tenderness  Throat:   Lips, mucosa, and tongue normal; teeth and gums normal  Neck:   Supple,  symmetrical, trachea midline, no adenopathy;    thyroid:  no enlargement/tenderness/nodules; no carotid   bruit or JVD  Back:     Symmetric, no curvature, ROM normal, no CVA tenderness  Lungs:     Clear to auscultation bilaterally, respirations unlabored  Chest Wall:    No tenderness or deformity   Heart:    Regular rate and rhythm, S1 and S2 normal, no murmur, rub   or gallop  Breast Exam:    No tenderness, masses, or nipple abnormality  Abdomen:     Soft, non-tender, bowel sounds active all four quadrants,    no masses, no organomegaly  Genitalia:    Normal female without lesion, discharge or tenderness  Extremities:   Extremities normal, atraumatic, no cyanosis or edema  Pulses:   2+ and symmetric all extremities  Skin:   Skin color, texture, turgor normal, no rashes or lesions  Lymph nodes:   Cervical, supraclavicular, and axillary nodes normal  Neurologic:   CNII-XII intact, normal strength, sensation and reflexes    throughout      Lab Review Urine pregnancy test Labs reviewed yes Radiologic studies reviewed yes  Assessment:    Pregnancy at [redacted]w[redacted]d weeks    Plan:     1. Supervision of  other normal pregnancy, antepartum Rx: - Prenat-Fe Poly-Methfol-FA-DHA (VITAFOL ULTRA) 29-0.6-0.4-200 MG CAPS; Take 1 capsule by mouth daily before breakfast.  Dispense: 90 capsule; Refill: 4   Prenatal vitamins.  Counseling provided regarding continued use of seat belts, cessation of alcohol consumption, smoking or use of illicit drugs; infection precautions i.e., influenza/TDAP immunizations, toxoplasmosis,CMV, parvovirus, listeria and varicella; workplace safety, exercise during pregnancy; routine dental care, safe medications, sexual activity, hot tubs, saunas, pools, travel, caffeine use, fish and methlymercury, potential toxins, hair treatments, varicose veins Weight gain recommendations per IOM guidelines reviewed: underweight/BMI< 18.5--> gain 28 - 40 lbs; normal weight/BMI 18.5 -  24.9--> gain 25 - 35 lbs; overweight/BMI 25 - 29.9--> gain 15 - 25 lbs; obese/BMI >30->gain  11 - 20 lbs Problem list reviewed and updated. FIRST/CF mutation testing/NIPT/QUAD SCREEN/fragile X/Ashkenazi Jewish population testing/Spinal muscular atrophy discussed: requested. Role of ultrasound in pregnancy discussed; fetal survey: requested. Amniocentesis discussed: not indicated.   Follow up in 2 weeks. 50% of 20 min visit spent on counseling and coordination of care.    Brock Bad, MD 10/07/2020 1:35 PM

## 2020-10-08 ENCOUNTER — Other Ambulatory Visit: Payer: Self-pay | Admitting: *Deleted

## 2020-10-22 ENCOUNTER — Other Ambulatory Visit: Payer: Self-pay

## 2020-10-22 ENCOUNTER — Ambulatory Visit (INDEPENDENT_AMBULATORY_CARE_PROVIDER_SITE_OTHER): Payer: Managed Care, Other (non HMO) | Admitting: Nurse Practitioner

## 2020-10-22 VITALS — BP 113/75 | HR 89 | Wt 130.2 lb

## 2020-10-22 DIAGNOSIS — Z3480 Encounter for supervision of other normal pregnancy, unspecified trimester: Secondary | ICD-10-CM

## 2020-10-22 DIAGNOSIS — Z3A16 16 weeks gestation of pregnancy: Secondary | ICD-10-CM

## 2020-10-22 NOTE — Progress Notes (Signed)
Pt reports fetal movement, denies pain.  

## 2020-10-22 NOTE — Progress Notes (Signed)
    Subjective:  Samantha Liu is a 29 y.o. G3P1011 at [redacted]w[redacted]d being seen today for ongoing prenatal care.  She is currently monitored for the following issues for this low-risk pregnancy and has Encounter for supervision of other normal pregnancy, unspecified trimester on their problem list.  Patient reports no complaints.  Contractions: Not present. Vag. Bleeding: None.  Movement: Present. Denies leaking of fluid.   The following portions of the patient's history were reviewed and updated as appropriate: allergies, current medications, past family history, past medical history, past social history, past surgical history and problem list. Problem list updated.  Objective:   Vitals:   10/22/20 1118  BP: 113/75  Pulse: 89  Weight: 130 lb 3.2 oz (59.1 kg)    Fetal Status: Fetal Heart Rate (bpm): 143 Fundal Height: 17 cm Movement: Present     General:  Alert, oriented and cooperative. Patient is in no acute distress.  Skin: Skin is warm and dry. No rash noted.   Cardiovascular: Normal heart rate noted  Respiratory: Normal respiratory effort, no problems with respiration noted  Abdomen: Soft, gravid, appropriate for gestational age. Pain/Pressure: Absent     Pelvic:  Cervical exam deferred        Extremities: Normal range of motion.  Edema: None  Mental Status: Normal mood and affect. Normal behavior. Normal judgment and thought content.   Urinalysis:      Assessment and Plan:  Pregnancy: G3P1011 at [redacted]w[redacted]d  1. Encounter for supervision of other normal pregnancy, unspecified trimester Had epidural with last pregnancy and plans epidural again Has had covid vaccine - dates entered into chart and advised 3rd dose 6 months after 2nd dose.  Client in agreement.  Will be due in January - advised to [facility] an appointment at [facility]. Given BP cuff and advised to check BP weekly and enter into Babyscripts.  Plans to download app today. Declines AFP Declines Flu   2. [redacted] weeks gestation of  pregnancy Vomiting stopped 2 weeks ago  Preterm labor symptoms and general obstetric precautions including but not limited to vaginal bleeding, contractions, leaking of fluid and fetal movement were reviewed in detail with the patient. Please refer to After Visit Summary for other counseling recommendations.  Return in about 4 weeks (around 11/19/2020) for in person ROB.  Nolene Bernheim, RN, MSN, NP-BC Nurse Practitioner, [facility] for [employer], [facility] 10/23/2020 7:38 AM

## 2020-11-10 ENCOUNTER — Other Ambulatory Visit: Payer: Managed Care, Other (non HMO)

## 2020-11-19 ENCOUNTER — Other Ambulatory Visit: Payer: Self-pay

## 2020-11-19 ENCOUNTER — Ambulatory Visit (INDEPENDENT_AMBULATORY_CARE_PROVIDER_SITE_OTHER): Payer: Managed Care, Other (non HMO)

## 2020-11-19 VITALS — BP 115/75 | HR 88 | Wt 137.0 lb

## 2020-11-19 DIAGNOSIS — Z3480 Encounter for supervision of other normal pregnancy, unspecified trimester: Secondary | ICD-10-CM

## 2020-11-19 DIAGNOSIS — Z3A20 20 weeks gestation of pregnancy: Secondary | ICD-10-CM

## 2020-11-19 NOTE — Progress Notes (Signed)
   LOW-RISK PREGNANCY OFFICE VISIT  Patient name: Samantha Liu MRN [id number]  Date of birth: [birth year] Chief Complaint:   Routine Prenatal Visit  Subjective:   Samantha Liu is a 30 y.o. G98P1011 female at [redacted]w[redacted]d with an Estimated Date of Delivery: 04/07/21 being seen today for ongoing management of a low-risk pregnancy aeb has Encounter for supervision of other normal pregnancy, unspecified trimester on their problem list.  Patient presents today with complaint of intermittent cramping/contractions. Patient has no pregnancy related concerns and endorses fetal movement.  She denies contractions and vaginal concerns including discharge, bleeding, leaking, odor, itching, or irritation.  Contractions: Not present. Vag. Bleeding: None.  Movement: Present.  Reviewed past medical,surgical, social, obstetrical and family history as well as problem list, medications and allergies.  Objective   Vitals:   11/19/20 1054  BP: 115/75  Pulse: 88  Weight: 137 lb (62.1 kg)  Body mass index is 22.8 kg/m.  Total Weight Gain:21 lb (9.526 kg)         Physical Examination:   General appearance: Well appearing, and in no distress  Mental status: Alert, oriented to person, place, and time  Skin: Warm & dry  Cardiovascular: Normal heart rate noted  Respiratory: Normal respiratory effort, no distress  Abdomen: Soft, gravid, nontender, AGA with Fundal height at umbilicus  Pelvic: Cervical exam deferred           Extremities: Edema: None  Fetal Status: Fetal Heart Rate (bpm): 147  Movement: Present   No results found for this or any previous visit (from the past 24 hour(s)).  Assessment & Plan:  Low-risk pregnancy of a 30 y.o., G3P1011 at [redacted]w[redacted]d with an Estimated Date of Delivery: 04/07/21   1. Encounter for supervision of other normal pregnancy, unspecified trimester -Anticipatory guidance for upcoming appts. -Patient to next appt in 4 weeks as an in-person visit. -Reviewed previous records from  [facility]. -Patient declined AFP there.  Discussed today and patient states she does not desire. -Reassured that normal to have some cramping with certain activities in early pregnancy. -Reviewed fetal movement.  Discussed "counting" movement after 23.5 weeks.   2. [redacted] weeks gestation of pregnancy -Doing well. -Discussed need for Anatomy US. -Patient agreeable to next available. -Scheduled for Monday Jan 24th at 0745.  Patient to report at 0730.     Meds: No orders of the defined types were placed in this encounter.  Labs/procedures today:  Lab Orders  No laboratory test(s) ordered today     Reviewed: Preterm labor symptoms and general obstetric precautions including but not limited to vaginal bleeding, contractions, leaking of fluid and fetal movement were reviewed in detail with the patient.  All questions were answered.  Follow-up: No follow-ups on file.  Orders Placed This Encounter  Procedures  . [place] MFM OB COMP + 14 WK  . AFP, Serum, Open Spina Bifida   Cherre Robins MSN, CNM 11/19/2020

## 2020-11-19 NOTE — Progress Notes (Signed)
ROB/AFP 

## 2020-11-19 NOTE — Patient Instructions (Signed)
Genetic Testing During Pregnancy Why is genetic testing done? Genetic testing during pregnancy is also called prenatal genetic testing. This type of testing can determine if your baby is at risk of being born with a disorder caused by abnormal genes or chromosomes (genetic disorder). Chromosomes contain genes that control how your baby will develop in your womb. There are many different genetic disorders. Examples of genetic disorders that may be found through genetic testing include Down syndrome and cystic fibrosis. Gene changes (mutations) can be passed down through families. Genetic testing is offered to women during pregnancy. You can choose whether to have genetic testing. Having genetic testing allows you to:  Discuss your test results and options with your health care provider.  Prepare for a baby that may be born with a genetic disorder. Learning about the disorder ahead of time helps you be better prepared to manage it. Your health care providers can also be prepared in case your baby requires special care before or after birth.  Consider whether you want to continue with the pregnancy. In some cases, genetic testing may be done to learn about the traits a child will inherit. Types of genetic tests There are two basic types of genetic testing. Screening tests indicate whether your developing baby (fetus) is at higher risk for a genetic disorder. Diagnostic tests check actual fetal cells to diagnose a genetic disorder. Screening tests Screening tests will not harm your baby. They are recommended for all pregnant women. Types of screening tests include:  Carrier screening. This test involves checking genes from both parents by testing their blood or saliva. The test checks to find out if the parents carry a genetic mutation that may be passed to a baby. In most cases, both parents must carry the mutation for a baby to be at risk.  First trimester screening. This test combines a blood test  with sound wave imaging of your baby (fetal ultrasound). This screening test checks for a risk of Down syndrome or other defects caused by having extra chromosomes. The ultrasound also checks for defects of the heart, abdomen, or skeleton.  Second trimester screening also combines a blood test with a fetal ultrasound exam. This test checks for a risk of Down syndrome or other defects caused by having extra chromosomes. The ultrasound allows your health care provider to look for genetic defects of the face, brain, spine, heart, or limbs. Some women may choose to only have an ultrasound exam without a blood test.  Combined or sequential screening. This type of testing combines the results of first and second trimester screening. This type of testing may be more accurate than first or second trimester screening alone.  Cell-free DNA testing. This is a blood test that detects cells released by the placenta that get into the mother's blood. It can be used to check for a risk of Down syndrome, other extra chromosome syndromes, and disorders caused by abnormal numbers of sex chromosomes. This test can be done any time after 10 weeks of pregnancy.      Diagnostic tests Diagnostic tests carry slight risks of problems, including bleeding, infection, and loss of the pregnancy. These tests are done only if your baby is at risk for a genetic disorder. Your health care provider will discuss the risks and benefits of having diagnostic tests before performing these types of tests. Examples of diagnostic tests include:  Chorionic villus sampling ([facility]). This involves a procedure to remove and test a sample of cells taken from the   placenta. The procedure may be done between 10 and 12 weeks of pregnancy.  Amniocentesis. This involves a procedure to remove and test a sample of fluid (amniotic fluid) and cells from the sac that surrounds the developing baby. The procedure may be done any time during the pregnancy, but it is  usually done between 15 and 20 weeks of pregnancy. What do the results mean? For a screening test:  If the results are negative, it often means that your child is not at higher risk. There is still a slight chance your child could have a genetic disorder.  If the results are positive, it does not mean your child will have a genetic disorder. It may mean that your child has a higher-than-normal risk for a genetic disorder. In that case, you should talk with your health care provider about whether you should have diagnostic genetic tests. For a diagnostic test:  If the result is negative, it is unlikely that your child will have a genetic disorder.  If the test is positive for a genetic disorder, it is likely that your child will have the disorder. The test may not tell how severe the disorder will be. Talk with your health care provider about your options. Talk with your health care provider about what your results mean. Questions to ask your health care provider Before talking to your health care provider about genetic testing, find out if there is a history of genetic disorders in your family. It may also help to know your family's ethnic origins. Then ask your health care provider the following questions:  Is my baby at risk for a genetic disorder?  What are the benefits of having genetic screening?  What tests are best for me and my baby?  What are the risks of each test?  If I get a positive result on a screening test, what is the next step?  Should I meet with a genetic counselor?  Should my partner or other members of my family be tested?  How much do the tests cost? Will my insurance cover the testing? Summary  Genetic testing is done during pregnancy to find out whether your child is at risk for a genetic disorder.  Genetic testing is offered to women during pregnancy. You can choose whether to have genetic testing.  There are two basic types of genetic testing. Screening  tests indicate whether your developing baby (fetus) is at higher risk for a genetic disorder. Diagnostic tests check actual fetal cells to diagnose a genetic disorder.  If a diagnostic genetic test is positive, talk with your health care provider about your options. This information is not intended to replace advice given to you by your health care provider. Make sure you discuss any questions you have with your health care provider. Document Revised: 05/08/2020 Document Reviewed: 05/08/2020 Elsevier Patient Education  2021 Elsevier Inc.  

## 2020-11-23 ENCOUNTER — Other Ambulatory Visit: Payer: Self-pay

## 2020-11-23 ENCOUNTER — Other Ambulatory Visit: Payer: Self-pay | Admitting: *Deleted

## 2020-11-23 ENCOUNTER — Ambulatory Visit: Payer: Managed Care, Other (non HMO)

## 2020-11-23 DIAGNOSIS — Z3480 Encounter for supervision of other normal pregnancy, unspecified trimester: Secondary | ICD-10-CM | POA: Diagnosis not present

## 2020-11-23 DIAGNOSIS — Z3A20 20 weeks gestation of pregnancy: Secondary | ICD-10-CM

## 2020-11-23 DIAGNOSIS — O283 Abnormal ultrasonic finding on antenatal screening of mother: Secondary | ICD-10-CM

## 2020-11-24 DIAGNOSIS — Q270 Congenital absence and hypoplasia of umbilical artery: Secondary | ICD-10-CM | POA: Insufficient documentation

## 2020-12-17 ENCOUNTER — Ambulatory Visit (INDEPENDENT_AMBULATORY_CARE_PROVIDER_SITE_OTHER): Payer: Managed Care, Other (non HMO) | Admitting: Obstetrics and Gynecology

## 2020-12-17 ENCOUNTER — Other Ambulatory Visit: Payer: Self-pay

## 2020-12-17 VITALS — BP 115/78 | HR 92 | Wt 149.0 lb

## 2020-12-17 DIAGNOSIS — Z3480 Encounter for supervision of other normal pregnancy, unspecified trimester: Secondary | ICD-10-CM

## 2020-12-17 DIAGNOSIS — Q270 Congenital absence and hypoplasia of umbilical artery: Secondary | ICD-10-CM

## 2020-12-17 DIAGNOSIS — Z3491 Encounter for supervision of normal pregnancy, unspecified, first trimester: Secondary | ICD-10-CM

## 2020-12-17 MED ORDER — PREPLUS 27-1 MG PO TABS: 1.0000 | ORAL_TABLET | Freq: Every day | ORAL | 13 refills | Status: DC

## 2020-12-17 NOTE — Progress Notes (Signed)
   PRENATAL VISIT NOTE  Subjective:  Samantha Liu is a 30 y.o. G3P1011 at [redacted]w[redacted]d being seen today for ongoing prenatal care.  She is currently monitored for the following issues for this low-risk pregnancy and has Encounter for supervision of other normal pregnancy, unspecified trimester and Two vessel umbilical cord on their problem list.  Patient reports no complaints.  Contractions: Not present. Vag. Bleeding: None.  Movement: Present. Denies leaking of fluid.   The following portions of the patient's history were reviewed and updated as appropriate: allergies, current medications, past family history, past medical history, past social history, past surgical history and problem list.   Objective:   Vitals:   12/17/20 1036  BP: 115/78  Pulse: 92  Weight: 149 lb (67.6 kg)    Fetal Status: Fetal Heart Rate (bpm): 147 Fundal Height: 24 cm Movement: Present     General:  Alert, oriented and cooperative. Patient is in no acute distress.  Skin: Skin is warm and dry. No rash noted.   Cardiovascular: Normal heart rate noted  Respiratory: Normal respiratory effort, no problems with respiration noted  Abdomen: Soft, gravid, appropriate for gestational age.  Pain/Pressure: Absent     Pelvic: Cervical exam deferred        Extremities: Normal range of motion.  Edema: None  Mental Status: Normal mood and affect. Normal behavior. Normal judgment and thought content.   Assessment and Plan:  Pregnancy: G3P1011 at [redacted]w[redacted]d 1. Encounter for supervision of other normal pregnancy, unspecified trimester  - Discussed MFM [place] and answered questions   Preterm labor symptoms and general obstetric precautions including but not limited to vaginal bleeding, contractions, leaking of fluid and fetal movement were reviewed in detail with the patient. Please refer to After Visit Summary for other counseling recommendations.   Return in about 4 weeks (around 01/14/2021), or For 2 hour GTT.  Future Appointments   Date Time Provider Department Center  12/21/2020  9:30 AM [facility] NURSE [facility]  12/21/2020  9:45 AM [facility] US5 [facility]  01/14/2021  9:00 AM [facility] None  01/14/2021  9:15 AM Gerrit Heck, CNM [facility] None    Venia Carbon, NP

## 2020-12-17 NOTE — Patient Instructions (Signed)
[facility] .  Com  

## 2020-12-21 ENCOUNTER — Other Ambulatory Visit: Payer: Self-pay

## 2020-12-21 ENCOUNTER — Other Ambulatory Visit: Payer: Self-pay | Admitting: *Deleted

## 2020-12-21 ENCOUNTER — Ambulatory Visit: Payer: Managed Care, Other (non HMO) | Attending: Obstetrics

## 2020-12-21 ENCOUNTER — Ambulatory Visit: Payer: Managed Care, Other (non HMO) | Admitting: *Deleted

## 2020-12-21 ENCOUNTER — Encounter: Payer: Self-pay | Admitting: *Deleted

## 2020-12-21 DIAGNOSIS — Z362 Encounter for other antenatal screening follow-up: Secondary | ICD-10-CM

## 2020-12-21 DIAGNOSIS — Z3480 Encounter for supervision of other normal pregnancy, unspecified trimester: Secondary | ICD-10-CM | POA: Diagnosis present

## 2020-12-21 DIAGNOSIS — O368932 Maternal care for other specified fetal problems, third trimester, fetus 2: Secondary | ICD-10-CM

## 2020-12-21 DIAGNOSIS — O09899 Supervision of other high risk pregnancies, unspecified trimester: Secondary | ICD-10-CM

## 2020-12-21 DIAGNOSIS — Z3A24 24 weeks gestation of pregnancy: Secondary | ICD-10-CM

## 2020-12-21 DIAGNOSIS — O359XX0 Maternal care for (suspected) fetal abnormality and damage, unspecified, not applicable or unspecified: Secondary | ICD-10-CM

## 2020-12-21 DIAGNOSIS — O283 Abnormal ultrasonic finding on antenatal screening of mother: Secondary | ICD-10-CM

## 2020-12-23 ENCOUNTER — Encounter: Payer: Self-pay | Admitting: Pediatrics

## 2021-01-04 ENCOUNTER — Encounter: Payer: Self-pay | Admitting: Obstetrics

## 2021-01-14 ENCOUNTER — Ambulatory Visit (INDEPENDENT_AMBULATORY_CARE_PROVIDER_SITE_OTHER): Payer: Managed Care, Other (non HMO)

## 2021-01-14 ENCOUNTER — Other Ambulatory Visit: Payer: Self-pay

## 2021-01-14 ENCOUNTER — Other Ambulatory Visit: Payer: Managed Care, Other (non HMO)

## 2021-01-14 VITALS — BP 119/80 | HR 87 | Wt 161.0 lb

## 2021-01-14 DIAGNOSIS — Z3A28 28 weeks gestation of pregnancy: Secondary | ICD-10-CM

## 2021-01-14 DIAGNOSIS — Z3480 Encounter for supervision of other normal pregnancy, unspecified trimester: Secondary | ICD-10-CM

## 2021-01-14 NOTE — Patient Instructions (Signed)
Healthy Weight Gain During Pregnancy A certain amount of weight gain during pregnancy is normal and healthy. The amount of weight you should gain during pregnancy depends on your overall health and your weight before you became pregnant. Talk with your health care provider to find out how much weight you should gain during your pregnancy. General guidelines for a healthy total weight gain during pregnancy are based on your body mass index (BMI) and are listed below. If your BMI at or before the start of your pregnancy is:  Less than 18.5 (underweight), you should gain 28-40 lb (13-18 kg).  18.5-24.9 (normal weight), you should gain 25-35 lb (11-16 kg).  25-29.9 (overweight), you should gain 15-25 lb (7-11 kg).  30 or higher (obese), you should gain 11-20 lb (5-9 kg). Your health care provider may recommend that you:  Gain more weight, if you were underweight before pregnancy or if you are pregnant with more than one baby.  Gain less weight, if you were overweight before pregnancy or if you are gaining too much weight during your pregnancy. How does unhealthy weight gain affect me? Gaining too much weight during pregnancy can lead to pregnancy complications, such as:  A temporary form of diabetes that develops during pregnancy (gestational diabetes).  Hypertensive disorders of pregnancy (preeclampsia or gestational hypertension).  Raising your risk of having a more difficult delivery or a surgical delivery (cesarean delivery, or C-section). How does unhealthy weight gain affect my baby? Not gaining enough weight can be life-threatening for your baby. It may also raise these risks for your baby:  Being born early (preterm).  Being smaller than normal during pregnancy or not growing normally (intrauterine growth restriction).  Having a low weight at birth. Gaining too much weight may raise these risks for your baby:  Growing larger than normal during pregnancy (large for gestational  age or macrosomia).  Increased risk of obesity. What actions can I take to gain a healthy amount of weight during pregnancy? Nutrition  Eat healthy foods. Every day, try to eat: ? Fruits and vegetables. Include a variety of colors and types, like sweet potatoes, oranges, apples, bell peppers, beets, berries, squash, and broccoli. ? Whole grains, such as millet, barley, whole-wheat breads and cereals, and oatmeal. ? Low-fat dairy products, like yogurt, milk, and cheese. You can also include non-dairy choices, like almond milk or rice milk. ? Protein-rich foods, like lean meat, chicken, eggs, peas, and beans.  Avoid foods that are fried or have a lot of fat, salt (sodium), or sugar.  Drink enough fluid to keep your urine pale yellow.  Choose healthy snack and drink options when you are away from home: ? Drink water. Avoid soda, sports drinks, and juices that have added sugar. ? Avoid drinks with caffeine, such as coffee and energy drinks. ? Eat snacks that are high in protein, such as nuts, protein bars, and low-fat yogurt. ? Carry convenient snacks with you that do not need refrigeration, such as a pack of trail mix, an apple, or a granola bar.  If you need help improving your diet, work with a health care provider or a dietitian.   Activity  Exercise regularly, as told by your health care provider. ? If you were active before becoming pregnant, you may be able to continue your regular fitness activities. ? If you were not active before pregnancy, you may gradually build up to exercising for 30 or more minutes on most days of the week. This may include walking, swimming,   or yoga.  Ask your health care provider what activities are safe for you. Talk with your health care provider about whether you may need to be excused from certain school or work activities.   Follow these instructions at home:  Take over-the-counter and prescription medicines only as told by your health care  provider.  Take all prenatal supplements as told by your health care provider.  Keep track of your weight gain during pregnancy.  Keep all health care visits during pregnancy (prenatal visits). These visits are a good time to discuss your weight gain. Your health care provider will weigh you at each visit to make sure you are gaining a healthy amount of weight. Where to find support Some pregnant women and teens face unique challenges and need extra support. If you have questions or need help gaining a healthy amount of weight during pregnancy, these people may help:  Your health care provider.  A dietitian.  Your school nurse.  Your family and friends.  Local counseling centers, church groups, or clinics that have services for teens. Where to find more information Learn more about managing your weight gain during pregnancy from:  American Pregnancy Association: americanpregnancy.org  U.[place]. Department of Agriculture pregnancy weight gain calculator: Wrestling[employer] Contact a health care provider if:  You are unable to eat or drink for longer than 24 hours.  You cannot afford food or have trouble accessing regular meals. Get help right away if you: Summary  Talk with your health care provider to find out how much weight you should gain during your pregnancy.  Too much or too little weight gain during pregnancy can lead to complications for you and your baby.  Eat healthy foods like fruits and vegetables, whole grains, low-fat dairy products, and protein-rich foods.  Ask your health care provider what activities are safe for you.  Keep all of your prenatal visits. This information is not intended to replace advice given to you by your health care provider. Make sure you discuss any questions you have with your health care provider. Document Revised: 05/14/2020 Document Reviewed: 05/14/2020 Elsevier Patient Education  2021 [employer].

## 2021-01-14 NOTE — Progress Notes (Signed)
ROB [redacted]w[redacted]d  2hr GTT today.   T-Dap: Declined .   CC: Hip Pain .

## 2021-01-14 NOTE — Progress Notes (Signed)
LOW-RISK PREGNANCY OFFICE VISIT  Patient name: Samantha Liu MRN [id number]  Date of birth: 04-16-1991 Chief Complaint:   Routine Prenatal Visit  Subjective:   Samantha Liu is a 30 y.o. G85P1011 female at [redacted]w[redacted]d with an Estimated Date of Delivery: 04/07/21 being seen today for ongoing management of a low-risk pregnancy aeb has Encounter for supervision of other normal pregnancy, unspecified trimester and Two vessel umbilical cord on their problem list.  Patient presents today with complaint of hip pain. 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  She reports her role requires her to perform a lot of pushing, pulling, bending, and walking.  She states she does not have any restrictions and feels that the "don't go easy" on her because she is pregnant.  Patient endorses fetal movement and denies abdominal cramping or contractions. Patient also denies vaginal concerns including abnormal discharge, leaking of fluid, and bleeding.  Contractions: Not present. Vag. Bleeding: None.  Movement: Present.  Reviewed past medical,surgical, social, obstetrical and family history as well as problem list, medications and allergies.  Objective   Vitals:   01/14/21 0915  BP: 119/80  Pulse: 87  Weight: 161 lb (73 kg)  Body mass index is 26.79 kg/m.  Total Weight Gain:45 lb (20.4 kg)         Physical Examination:   General appearance: Well appearing, and in no distress  Mental status: Alert, oriented to person, place, and time  Skin: Warm & dry  Cardiovascular: Normal heart rate noted  Respiratory: Normal respiratory effort, no distress  Abdomen: Soft, gravid, nontender, AGA with Fundal height of Fundal Height: 27 cm  Pelvic: Cervical exam deferred           Extremities: Edema: None  Fetal Status: Fetal Heart Rate (bpm): 148  Movement: Present   No results found for this or any previous visit (from the past 24 hour(s)).  Assessment & Plan:  Low-risk  pregnancy of a 30 y.o., G3P1011 at [redacted]w[redacted]d with an Estimated Date of Delivery: 04/07/21   1. Encounter for supervision of other normal pregnancy, unspecified trimester -Anticipatory guidance for upcoming appts. -Patient to next appt in 2 weeks for a in-person or virtual visit as she desires. -GTT completed today and discussed releasing of results to mychart. -Reviewed how abnormal results are handled. -Reviewed Fetal Echo and patient aware of need to take infant for follow up 2-3 weeks after birth.  2. [redacted] weeks gestation of pregnancy -Doing well overall. -Addressed c/o hip/pelvic pain. -Reviewed usage of maternity belt and tylenol usage while working. -Informed that these interventions may not resolve pain, but should decrease duration. -Further discussed patient desire to take FMLA 4 weeks prior to due date. Patient states job is agreeable to this. -Provider acknowledges difficulties of job, but informs patient that she has no medical indication to be out of work one month before EDD.  However, provider encourages patient to take the time if she can get it!    Meds: No orders of the defined types were placed in this encounter.  Labs/procedures today:  Lab Orders     Glucose Tolerance, 2 Hours w/1 Hour     CBC     RPR     HIV Antibody (routine testing w rflx)   Reviewed: Preterm labor symptoms and general obstetric precautions including but not limited to vaginal bleeding, contractions, leaking of fluid and fetal movement were reviewed in detail with the patient.  All questions were  answered.  Follow-up: Return in about 2 weeks (around 01/28/2021) for LROB.  Orders Placed This Encounter  Procedures  . Glucose Tolerance, 2 Hours w/1 Hour  . CBC  . RPR  . HIV Antibody (routine testing w rflx)   Cherre Robins MSN, CNM 01/14/2021

## 2021-01-15 LAB — CBC
Hematocrit: 36.3 % (ref 34.0–46.6)
Hemoglobin: 12.5 g/dL (ref 11.1–15.9)
MCH: 32.2 pg (ref 26.6–33.0)
MCHC: 34.4 g/dL (ref 31.5–35.7)
MCV: 94 fL (ref 79–97)
Platelets: 215 10*3/uL (ref 150–450)
RBC: 3.88 x10E6/uL (ref 3.77–5.28)
RDW: 12.6 % (ref 11.7–15.4)
WBC: 12.2 10*3/uL — ABNORMAL HIGH (ref 3.4–10.8)

## 2021-01-15 LAB — HIV ANTIBODY (ROUTINE TESTING W REFLEX): HIV Screen 4th Generation wRfx: NONREACTIVE

## 2021-01-15 LAB — GLUCOSE TOLERANCE, 2 HOURS W/ 1HR
Glucose, 1 hour: 68 mg/dL (ref 65–179)
Glucose, 2 hour: 73 mg/dL (ref 65–152)
Glucose, Fasting: 77 mg/dL (ref 65–91)

## 2021-01-15 LAB — RPR: RPR Ser Ql: NONREACTIVE

## 2021-01-18 ENCOUNTER — Ambulatory Visit: Payer: Managed Care, Other (non HMO) | Attending: Obstetrics and Gynecology | Admitting: *Deleted

## 2021-01-18 ENCOUNTER — Other Ambulatory Visit: Payer: Self-pay

## 2021-01-18 ENCOUNTER — Ambulatory Visit (HOSPITAL_BASED_OUTPATIENT_CLINIC_OR_DEPARTMENT_OTHER): Payer: Managed Care, Other (non HMO)

## 2021-01-18 ENCOUNTER — Encounter: Payer: Self-pay | Admitting: *Deleted

## 2021-01-18 DIAGNOSIS — O359XX0 Maternal care for (suspected) fetal abnormality and damage, unspecified, not applicable or unspecified: Secondary | ICD-10-CM | POA: Insufficient documentation

## 2021-01-18 DIAGNOSIS — O09899 Supervision of other high risk pregnancies, unspecified trimester: Secondary | ICD-10-CM

## 2021-01-18 DIAGNOSIS — O358XX0 Maternal care for other (suspected) fetal abnormality and damage, not applicable or unspecified: Secondary | ICD-10-CM | POA: Diagnosis not present

## 2021-01-18 DIAGNOSIS — Z3480 Encounter for supervision of other normal pregnancy, unspecified trimester: Secondary | ICD-10-CM

## 2021-01-18 DIAGNOSIS — Z3A28 28 weeks gestation of pregnancy: Secondary | ICD-10-CM

## 2021-01-18 DIAGNOSIS — O368930 Maternal care for other specified fetal problems, third trimester, not applicable or unspecified: Secondary | ICD-10-CM | POA: Diagnosis not present

## 2021-01-18 DIAGNOSIS — Q270 Congenital absence and hypoplasia of umbilical artery: Secondary | ICD-10-CM | POA: Diagnosis not present

## 2021-01-19 ENCOUNTER — Other Ambulatory Visit: Payer: Self-pay | Admitting: *Deleted

## 2021-01-19 DIAGNOSIS — Q2547 Right aortic arch: Secondary | ICD-10-CM

## 2021-01-28 ENCOUNTER — Ambulatory Visit (INDEPENDENT_AMBULATORY_CARE_PROVIDER_SITE_OTHER): Payer: Managed Care, Other (non HMO)

## 2021-01-28 ENCOUNTER — Other Ambulatory Visit: Payer: Self-pay

## 2021-01-28 VITALS — BP 119/76 | HR 85 | Wt 164.0 lb

## 2021-01-28 DIAGNOSIS — Q2547 Right aortic arch: Secondary | ICD-10-CM

## 2021-01-28 DIAGNOSIS — Z3480 Encounter for supervision of other normal pregnancy, unspecified trimester: Secondary | ICD-10-CM

## 2021-01-28 DIAGNOSIS — Z3A30 30 weeks gestation of pregnancy: Secondary | ICD-10-CM

## 2021-01-28 NOTE — Progress Notes (Signed)
ROB [redacted]w[redacted]d PHQ 9 = 0   CC:  None

## 2021-01-28 NOTE — Progress Notes (Signed)
   LOW-RISK PREGNANCY OFFICE VISIT  Patient name: Samantha Liu MRN [id number]  Date of birth: 1991-08-05 Chief Complaint:   Routine Prenatal Visit  Subjective:   Samantha Liu is a 30 y.o. 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  Patient presents today without complaint. However, she expresses some concern over possible interventions including surgeries needed for infant after delivery.  Patient endorses fetal movement.  She denies abdominal pain or contractions. Patient denies vaginal concerns including abnormal discharge, leaking of fluid, and bleeding.  Contractions: Not present. Vag. Bleeding: None.  Movement: Present.  Reviewed past medical,surgical, social, obstetrical and family history as well as problem list, medications and allergies.  Objective   Vitals:   01/28/21 1052  BP: 119/76  Pulse: 85  Weight: 164 lb (74.4 kg)  Body mass index is 27.29 kg/m.  Total Weight Gain:48 lb (21.8 kg)         Physical Examination:   General appearance: Well appearing, and in no distress  Mental status: Alert, oriented to person, place, and time  Skin: Warm & dry  Cardiovascular: Normal heart rate noted  Respiratory: Normal respiratory effort, no distress  Abdomen: Soft, gravid, nontender, AGA with Fundal height of Fundal Height: 30 cm  Pelvic: Cervical exam deferred           Extremities: Edema: None  Fetal Status: Fetal Heart Rate (bpm): 140  Movement: Present   No results found for this or any previous visit (from the past 24 hour(s)).  Assessment & Plan:  Low-risk pregnancy of a 30 y.o., G3P1011 at [redacted]w[redacted]d with an Estimated Date of Delivery: 04/07/21   1. Encounter for supervision of other normal pregnancy,  unspecified trimester -Anticipatory guidance for upcoming appts. -Patient to next appt in 3 weeks for a virtual visit.   2. [redacted] weeks gestation of pregnancy -Doing well overall. -Addressed concerns.   3. 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 -Attempted to articulate impressions in clear terms. -Patient informed that provider can not speak about what necessary interventions would be necessary for infant after delivery. -Encouraged not to worry now as nothing can be done prior to delivery. -Instructed to ask questions about potential surgeries/interventions at next MFM appt.  -Patient seems to be receptive to feedback and limited education that provider gives.  Appears less anxious by end of appt.     Meds: No orders of the defined types were placed in this encounter.  Labs/procedures today:  Lab Orders  No laboratory test(s) ordered today     Reviewed: Preterm labor symptoms and general obstetric precautions including but not limited to vaginal bleeding, contractions, leaking of fluid and fetal movement were reviewed in detail with the patient.  All questions were answered.  Follow-up: Return in about 3 weeks (around 02/18/2021) for Virtual LR-ROB.  No orders of the defined types were placed in this encounter.  Cherre Robins MSN, CNM 01/28/2021

## 2021-02-03 ENCOUNTER — Telehealth: Payer: Self-pay

## 2021-02-03 NOTE — Telephone Encounter (Signed)
Called to get dating for maternity leave paperwork, no answer, VM is full

## 2021-02-15 ENCOUNTER — Telehealth (INDEPENDENT_AMBULATORY_CARE_PROVIDER_SITE_OTHER): Payer: Managed Care, Other (non HMO) | Admitting: Obstetrics and Gynecology

## 2021-02-15 VITALS — BP 108/78 | HR 90

## 2021-02-15 DIAGNOSIS — O358XX0 Maternal care for other (suspected) fetal abnormality and damage, not applicable or unspecified: Secondary | ICD-10-CM | POA: Diagnosis not present

## 2021-02-15 DIAGNOSIS — Q2547 Right aortic arch: Secondary | ICD-10-CM

## 2021-02-15 DIAGNOSIS — Q270 Congenital absence and hypoplasia of umbilical artery: Secondary | ICD-10-CM

## 2021-02-15 DIAGNOSIS — Z3A32 32 weeks gestation of pregnancy: Secondary | ICD-10-CM

## 2021-02-15 DIAGNOSIS — Z3480 Encounter for supervision of other normal pregnancy, unspecified trimester: Secondary | ICD-10-CM

## 2021-02-15 NOTE — Progress Notes (Signed)
OBSTETRICS PRENATAL VIRTUAL VISIT ENCOUNTER NOTE  [facility]: [facility]   Patient location: Home  I connected with Samantha Liu on 02/15/21 at 10:00 AM EDT by MyChart Video Encounter and verified that I am speaking with the correct person using two identifiers. I discussed the limitations, risks, security and privacy concerns of performing an evaluation and management service virtually and the availability of in person appointments. I also discussed with the patient that there may be a patient responsible charge related to this service. The patient expressed understanding and agreed to proceed. Subjective:  Samantha Liu is a 30 y.o. G3P1011 at [redacted]w[redacted]d being seen today for ongoing prenatal care.  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  Discussed the right sided aortic arch.  Pt is aware the baby will be further evaluated after delivery.  To her knowledge and review of MFM comments, no early delivery at this time.  Patient reports no complaints.  Contractions: Not present. Vag. Bleeding: None.  Movement: Present. Denies any leaking of fluid.   The following portions of the patient's history were reviewed and updated as appropriate: allergies, current medications, past family history, past medical history, past social history, past surgical history and problem list.   Objective:   Vitals:   02/15/21 0927  BP: 108/78  Pulse: 90    Fetal Status:     Movement: Present     General:  Alert, oriented and cooperative. Patient is in no acute distress.  Respiratory: Normal respiratory effort, no problems with respiration noted  Mental Status: Normal mood and affect. Normal behavior. Normal judgment and  thought content.  Rest of physical exam deferred due to type of encounter  Imaging: [place] MFM OB FOLLOW UP  Result Date: 01/18/2021 ----------------------------------------------------------------------  OBSTETRICS REPORT                       (Signed Final 01/18/2021 05:12 pm) ---------------------------------------------------------------------- Patient Info  ID #:       [id number]                          D.O.B.:  08-14-1991 (29 yrs)  Name:       Samantha Liu                  Visit Date: 01/18/2021 04:01 pm ---------------------------------------------------------------------- Performed By  Attending:        Ma Rings MD         Ref. Address:     Faculty  Performed By:     Ceasar Lund        [facility]  Referred By:      Catalina Antigua MD ---------------------------------------------------------------------- Orders  #  Description                           Code        Ordered By  1  [place] MFM OB FOLLOW UP                   16109.60    Noralee Space ----------------------------------------------------------------------  #  Order #                     Accession #                Episode #  1  [id number]                   [id number]                 [id number] ---------------------------------------------------------------------- Indications  2 vessel umbilical cord                        O69.89X0  Fetal abnormality - other known or             O35.9XX0  suspected (right AA, confirmed by fetal echo)  Encounter for other antenatal screening        Z36.2  follow-up  Low risk NIPS  [redacted] weeks gestation of pregnancy                Z3A.28 ---------------------------------------------------------------------- Fetal Evaluation  Num Of Fetuses:         1  Fetal Heart  Rate(bpm):  140  Cardiac Activity:       Observed  Presentation:           Cephalic  Placenta:               Posterior  P. Cord Insertion:      Visualized, central  Amniotic Fluid  AFI FV:      Within normal limits  AFI Sum(cm)     %Tile       Largest Pocket(cm)  17.7            67          5.8  RUQ(cm)       RLQ(cm)       LUQ(cm)        LLQ(cm)  5.8           3.4           4              4.5 ---------------------------------------------------------------------- Biometry  BPD:        73  mm     G. Age:  29w 2d         57  %    CI:        70.23   %    70 - 86  FL/HC:      18.5   %    19.6 - 20.8  HC:      277.8  mm     G. Age:  30w 3d         69  %    HC/AC:      1.10        0.99 - 1.21  AC:      251.7  mm     G. Age:  29w 3d         64  %    FL/BPD:     70.5   %    71 - 87  FL:       51.5  mm     G. Age:  27w 4d          9  %    FL/AC:      20.5   %    20 - 24  LV:          2  mm  Est. FW:    1295  gm    2 lb 14 oz      42  % ---------------------------------------------------------------------- OB History  Blood Type:   B+  Gravidity:    3         Term:   1        Prem:   0        SAB:   1  TOP:          0       Ectopic:  0        Living: 1 ---------------------------------------------------------------------- Gestational Age  LMP:           32w 2d        Date:  06/06/20                 EDD:   03/13/21  U/S Today:     29w 1d                                        EDD:   04/04/21  Best:          28w 5d     Det. By:  U/S  (11/23/20)          EDD:   04/07/21 ---------------------------------------------------------------------- Anatomy  Cranium:               Appears normal         LVOT:                   Previously seen  Cavum:                 Appears normal         Aortic Arch:            Right sided aortic                                                                        arch  Ventricles:            Appears normal  Ductal Arch:             Previously seen  Choroid Plexus:        Previously seen        Diaphragm:              Appears normal  Cerebellum:            Previously seen        Stomach:                Appears normal, left                                                                        sided  Posterior Fossa:       Previously seen        Abdomen:                Previously seen  Nuchal Fold:           Previously seen        Abdominal Wall:         Previously seen  Face:                  Profile nl; orbits     Cord Vessels:           Known 2 Vessel                         prev visualized                                                                        Cord  Lips:                  Previously seen        Kidneys:                Appear normal  Palate:                Previously seen        Bladder:                Appears normal  Thoracic:              Previously seen        Spine:                  Previously seen  Heart:                 Appears normal         Upper Extremities:      Previously seen                         (4CH, axis, and                         situs)  RVOT:  Previously seen        Lower Extremities:      Previously seen  Other:  Female gender previously seen. Heels/feet and open hands/5th digits          previously visualized. Lenses previously visualized. 3VV, VC          visualized. ---------------------------------------------------------------------- Comments  This patient was seen for a follow up growth scan due to a  fetus with a two-vessel cord.  A right-sided aortic arch has  been noted on her fetal echocardiogram.  The patient reports  that the pediatric cardiologist has recommended that her  baby be evaluated after birth to determine if any treatment is  necessary.  She denies any problems since her last exam.  She was informed that the fetal growth and amniotic fluid  level appears appropriate for her gestational age.  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  The  fetal trachea  and esophagus appears to be located in  between two large blood vessels, the pulmonary artery and  the right-sided aortic arch or ductus arteriosus.  The patient  was advised that these two blood vessels may form a  vascular ring which may cause compression of the trachea  and esophagus causing symptoms of choking, noisy  breathing, and difficulty swallowing after birth.  As the symptoms associated with a vascular ring are variable  and may not develop for some time after birth, the pediatric  cardiologist has stated that the patient may be able to deliver  at the [facility] of [place].  If the baby were to  develop symptoms after birth, a cardiac CT angiogram can  be performed to assess the head and neck vessels and to  determine if a surgical release of the ring is necessary.  The patient's case will be presented at a future [facility]  meeting.  A follow up exam was scheduled in 4 weeks. ----------------------------------------------------------------------                   Ma Rings, MD Electronically Signed Final Report   01/18/2021 05:12 pm ----------------------------------------------------------------------   Assessment and Plan:  Pregnancy: G3P1011 at [redacted]w[redacted]d 1. Encounter for supervision of other normal pregnancy, unspecified trimester Continue routine care  2. 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 Two vessel umbilical cord   4. [redacted] weeks gestation of pregnancy   Preterm labor symptoms and general obstetric precautions including but not limited to vaginal bleeding, contractions, leaking of fluid and fetal movement were reviewed in detail with the patient. I discussed the assessment and treatment plan with the patient. The patient was provided an opportunity to ask questions and all were answered. The patient agreed with the plan and demonstrated an understanding of the instructions. The patient was advised to call back or seek an  in-person office evaluation/go to MAU at [facility] for any urgent or concerning symptoms. Please refer to After Visit Summary for other counseling recommendations.   I provided 10 minutes of face-to-face time during this encounter.  Return in about 2 weeks (around 03/01/2021) for virtual, ROB.  Future Appointments  Date Time Provider Department Center  02/15/2021 10:00 AM Warden Fillers, MD [facility] None  02/16/2021  2:30 PM [facility] NURSE [facility]  02/16/2021  2:45 PM [facility] US5 [facility]    Warden Fillers, MD [facility] [employer], [facility]

## 2021-02-15 NOTE — Progress Notes (Signed)
I connected with  Samantha Liu on 02/15/21 by a video enabled telemedicine application and verified that I am speaking with the correct person using two identifiers.   I discussed the limitations of evaluation and management by telemedicine. The patient expressed understanding and agreed to proceed.  MyChart ROB, reports no problems today.

## 2021-02-16 ENCOUNTER — Ambulatory Visit: Payer: Managed Care, Other (non HMO) | Attending: Obstetrics

## 2021-02-16 ENCOUNTER — Other Ambulatory Visit: Payer: Self-pay

## 2021-02-16 ENCOUNTER — Other Ambulatory Visit: Payer: Self-pay | Admitting: *Deleted

## 2021-02-16 ENCOUNTER — Ambulatory Visit: Payer: Managed Care, Other (non HMO) | Admitting: *Deleted

## 2021-02-16 ENCOUNTER — Encounter: Payer: Self-pay | Admitting: *Deleted

## 2021-02-16 DIAGNOSIS — Q2547 Right aortic arch: Secondary | ICD-10-CM | POA: Insufficient documentation

## 2021-02-16 DIAGNOSIS — Z332 Encounter for elective termination of pregnancy: Secondary | ICD-10-CM | POA: Diagnosis not present

## 2021-02-16 DIAGNOSIS — Z3480 Encounter for supervision of other normal pregnancy, unspecified trimester: Secondary | ICD-10-CM | POA: Diagnosis present

## 2021-02-16 DIAGNOSIS — Z362 Encounter for other antenatal screening follow-up: Secondary | ICD-10-CM

## 2021-02-16 DIAGNOSIS — O358XX0 Maternal care for other (suspected) fetal abnormality and damage, not applicable or unspecified: Secondary | ICD-10-CM

## 2021-02-16 DIAGNOSIS — Q270 Congenital absence and hypoplasia of umbilical artery: Secondary | ICD-10-CM

## 2021-02-17 ENCOUNTER — Inpatient Hospital Stay (HOSPITAL_COMMUNITY)
Admission: AD | Admit: 2021-02-17 | Discharge: 2021-02-17 | Discharge status: Against Medical Advice | Payer: Managed Care, Other (non HMO) | Attending: Obstetrics & Gynecology | Admitting: Obstetrics & Gynecology

## 2021-02-17 ENCOUNTER — Other Ambulatory Visit: Payer: Self-pay

## 2021-02-17 NOTE — MAU Note (Signed)
Called for patient. Patient not in lobby. Registration stated she had left.

## 2021-02-18 ENCOUNTER — Telehealth: Payer: Self-pay | Admitting: *Deleted

## 2021-02-18 NOTE — Telephone Encounter (Signed)
Received call from [facility] with [facility] today.  States pt was seen at [facility] and was transferred to their care due to [facility].   Practice manger as well as Faculty provider made aware of call and pt will be delivering at [facility].

## 2021-03-01 ENCOUNTER — Other Ambulatory Visit: Payer: Self-pay

## 2021-03-17 ENCOUNTER — Ambulatory Visit: Payer: Managed Care, Other (non HMO)

## 2021-04-12 ENCOUNTER — Ambulatory Visit (INDEPENDENT_AMBULATORY_CARE_PROVIDER_SITE_OTHER): Payer: Managed Care, Other (non HMO) | Admitting: Advanced Practice Midwife

## 2021-04-12 ENCOUNTER — Encounter: Payer: Self-pay | Admitting: Advanced Practice Midwife

## 2021-04-12 ENCOUNTER — Other Ambulatory Visit: Payer: Self-pay

## 2021-04-12 ENCOUNTER — Ambulatory Visit: Payer: Managed Care, Other (non HMO) | Admitting: Obstetrics

## 2021-04-12 VITALS — BP 112/69 | HR 81 | Wt 169.0 lb

## 2021-04-12 DIAGNOSIS — Z8759 Personal history of other complications of pregnancy, childbirth and the puerperium: Secondary | ICD-10-CM

## 2021-04-12 DIAGNOSIS — Z3009 Encounter for other general counseling and advice on contraception: Secondary | ICD-10-CM | POA: Diagnosis not present

## 2021-04-12 DIAGNOSIS — Z3045 Encounter for surveillance of transdermal patch hormonal contraceptive device: Secondary | ICD-10-CM | POA: Diagnosis not present

## 2021-04-12 MED ORDER — XULANE 150-35 MCG/24HR TD PTWK: 1.0000 | MEDICATED_PATCH | TRANSDERMAL | 10 refills | Status: DC

## 2021-04-12 NOTE — Progress Notes (Signed)
Pt presents for visit to discuss Contraception.  Vaginal delivery 02/25/21 Pt considering Depo.or patch.  LMP:03/29/21-04/03/21  Pt states she had PP virtual F/U with [facility].    Pt last had intercourse x 1 week ago.

## 2021-04-12 NOTE — Progress Notes (Signed)
  GYNECOLOGY PROGRESS NOTE  History:  30 y.o. [id number]  presents to [facility] office today for problem gyn visit. She reports a desire to start new contraceptive method. She had trouble conceiving with both her previous pregnancies so has not used contraception for many years. But, she does not want another pregnancy right now so desires to prevent pregnancy. She is a nonsmoker with no hx HTN, migraines, DVT/PE.  She denies h/a, dizziness, shortness of breath, n/v, or fever/chills.    The following portions of the patient's history were reviewed and updated as appropriate: allergies, current medications, past family history, past medical history, past social history, past surgical history and problem list. Last pap smear on 08/18/2020 was normal.  Health Maintenance Due  Topic Date Due   COVID-19 Vaccine (3 - Booster for Moderna series) 10/06/2020     Review of Systems:  Pertinent items are noted in HPI.   Objective:  Physical Exam Last menstrual period 06/06/2020, unknown if currently breastfeeding. VS reviewed, nursing note reviewed,  Constitutional: well developed, well nourished, no distress HEENT: normocephalic CV: normal rate Pulm/chest wall: normal effort Breast Exam: deferred Abdomen: soft Neuro: alert and oriented x 3 Skin: warm, dry Psych: affect normal Pelvic exam: Deferred  Assessment & Plan:  1. Encounter for counseling regarding contraception --Discussed LARCs as most effective forms of birth control.  Discussed benefits/risks of other methods.  Pt desires contraceptive patch.  Discussed failure rates of OCPs with regular use, likely improved with weekly dosing of patch.  Reviewed increased risks of DVT/PE with estrogen containing contraceptives, pt without any other risk factors. --F/U in 3 months  - norelgestromin-ethinyl estradiol [employer]) 150-35 MCG/24HR transdermal patch; Place 1 patch onto the skin once a week.  Dispense: 3 patch; Refill: 10   Sharen Counter, CNM 1:56 PM

## 2021-04-12 NOTE — Patient Instructions (Signed)

## 2021-09-21 IMAGING — US US OB < 14 WEEKS - US OB TV
1 series · 15 of 28 positions shown · non-contrast
Comparison: None.

CLINICAL DATA: Vaginal bleeding that began today, cramping

EXAM:
OBSTETRIC <14 WK ULTRASOUND
TECHNIQUE: Transabdominal ultrasound was performed for evaluation of the
gestation as well as the maternal uterus and adnexal regions.

[Series 1: us ob < 14 weeks - us ob tv · 15 of 32 slices shown]
[im 1/32]
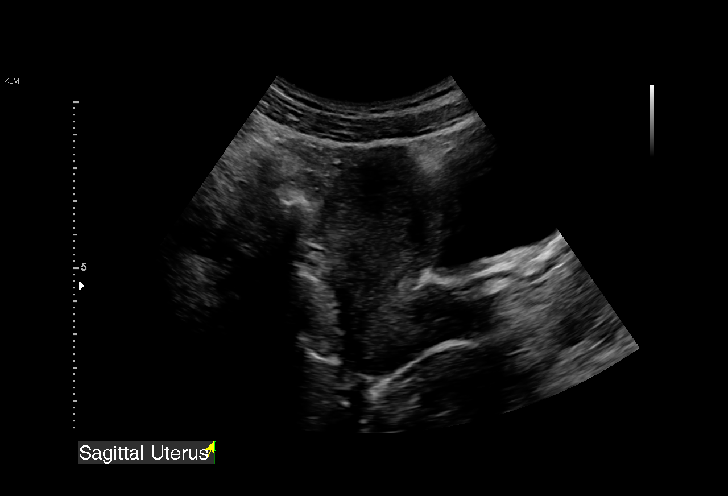
[im 3/32]
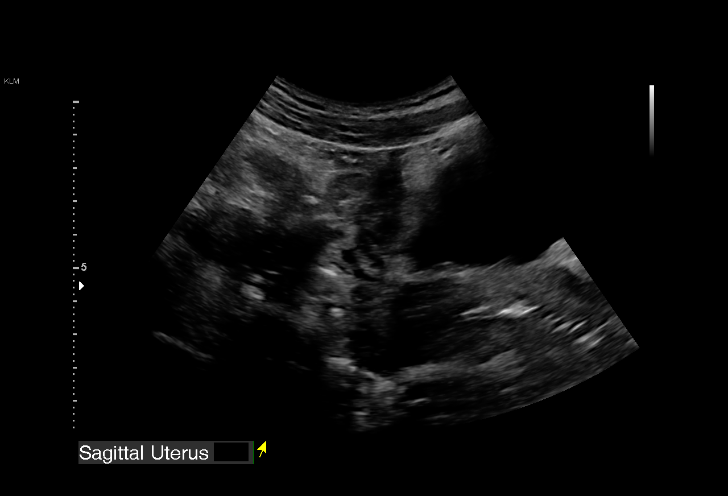
[im 5/32]
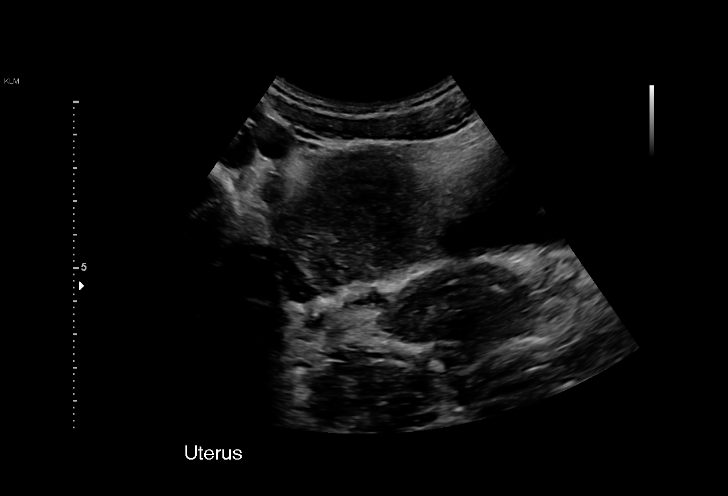
[im 7/32]
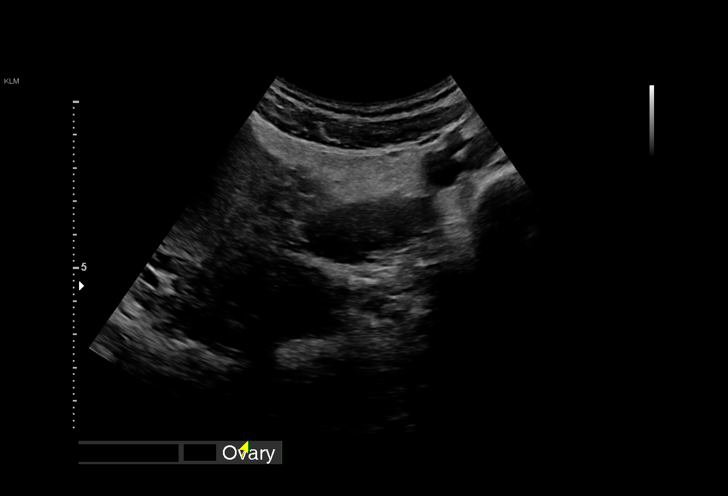
[im 10/32]
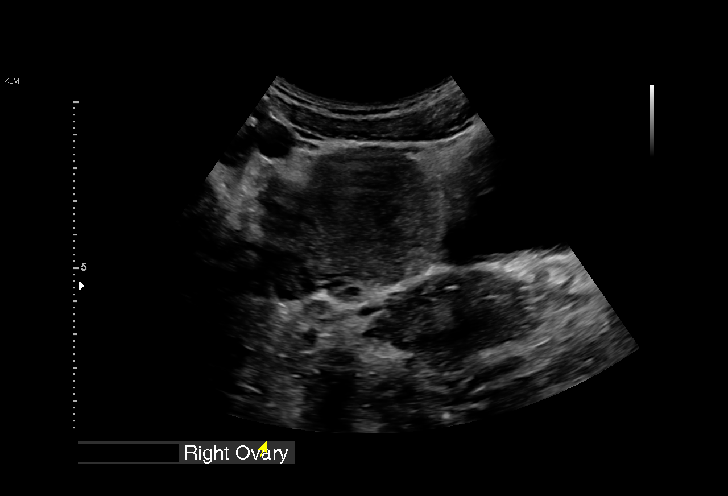
[im 12/32]
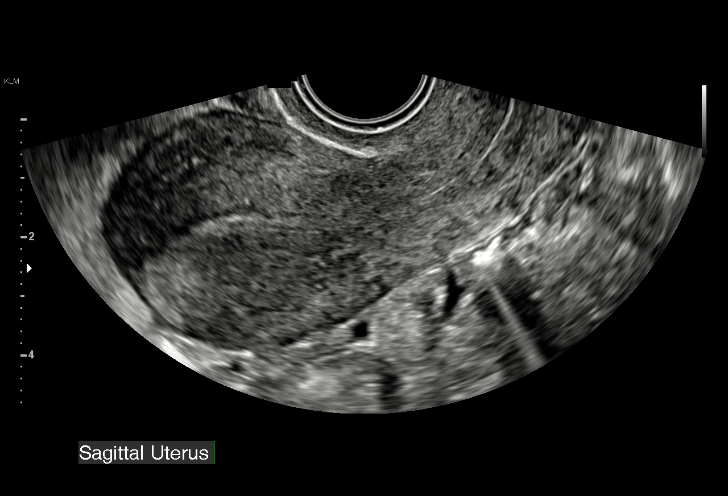
[im 14/32]
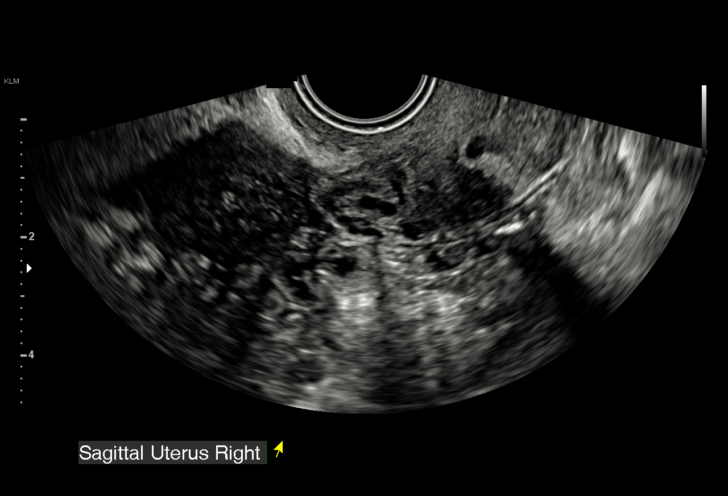
[im 17/32]
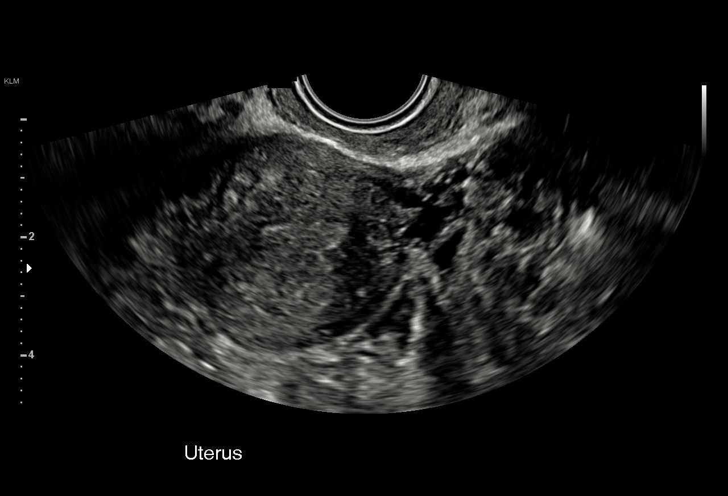
[im 18/32]
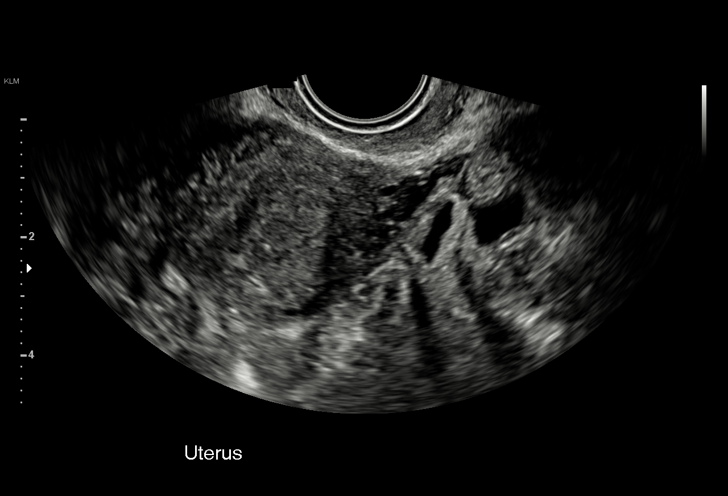
[im 20/32]
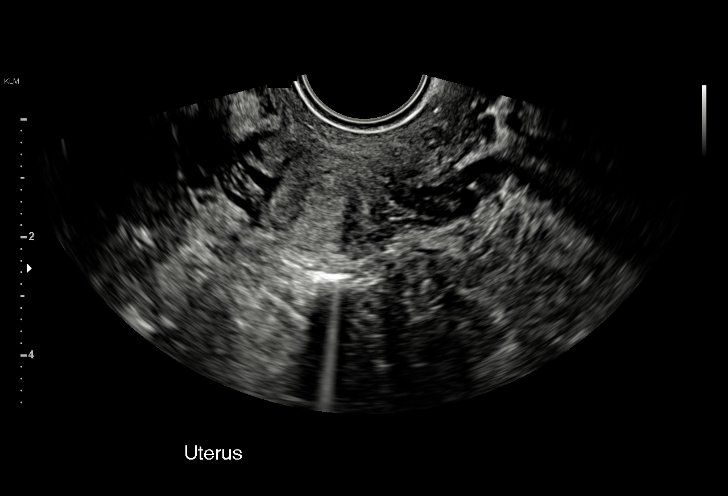
[im 22/32]
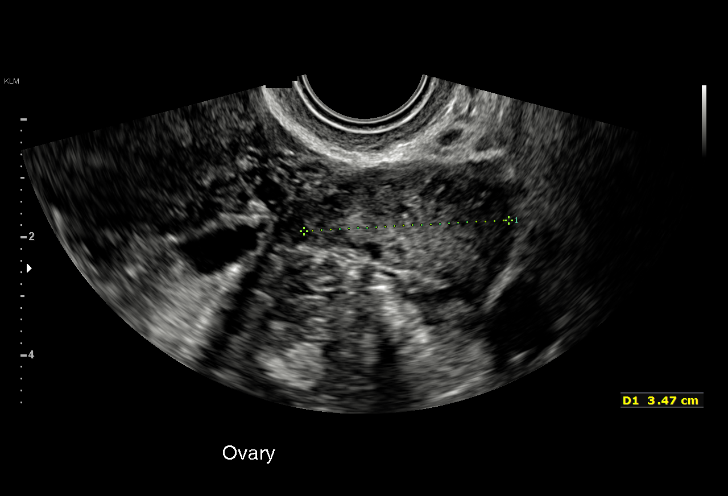
[im 25/32]
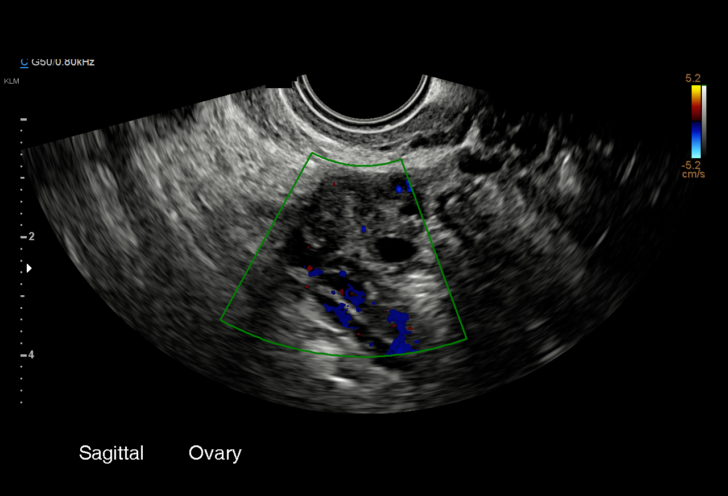
[im 27/32]
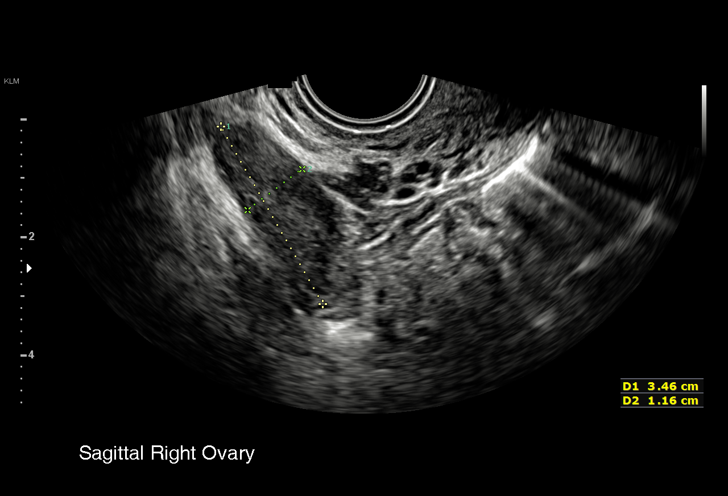
[im 29/32]
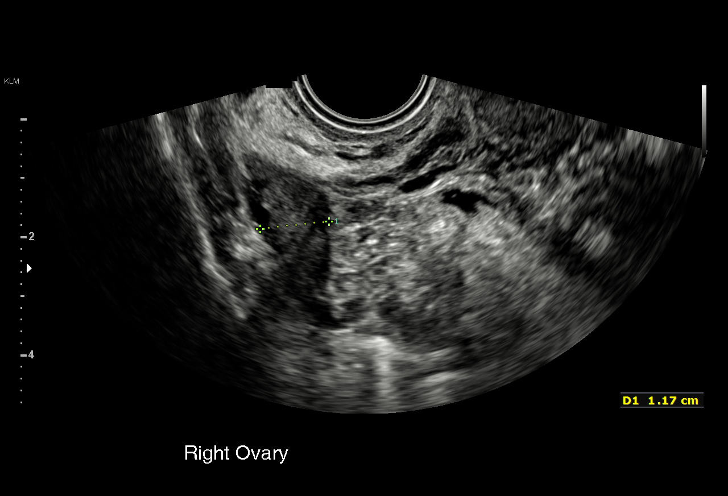
[im 32/32]
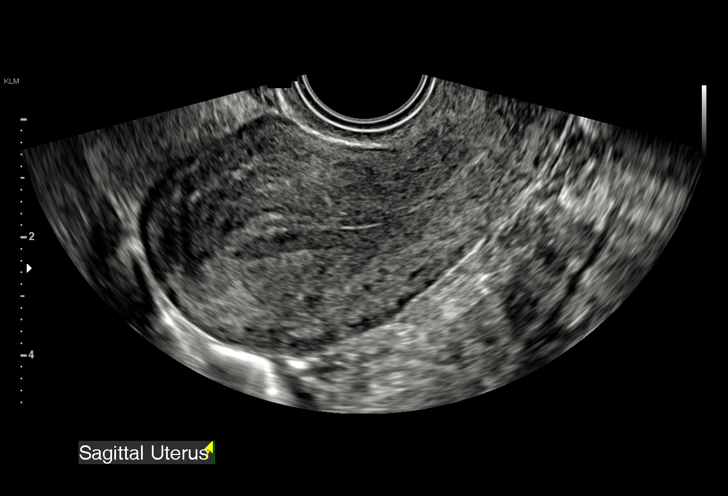

[15 of 28 positions shown; findings below may reference images not displayed]

FINDINGS: Intrauterine gestational sac: None

Yolk sac:  Not Visualized.

Embryo:  Not Visualized.

Maternal uterus/adnexae: Uterus is anteverted. Minimal fluid within
the endometrial cavity. Endometrium measures up to 8 mm in
thickness.

Left ovary measures 3.5 x 2.7 x 1.8 cm in the right ovary measures
3.5 x 1.2 x 1.2 cm. Both ovaries demonstrate normal color flow.
IMPRESSION: 1. No evidence of intrauterine pregnancy at this time. Serial beta
HCG measurements and follow-up ultrasound may be needed to document
a live intrauterine pregnancy.
2. Otherwise unremarkable exam.

## 2022-08-30 IMAGING — US US MFM OB DETAIL+14 WK
1 series · 12 of 28 positions shown · non-contrast
Comparison: none

[Series 1: us mfm ob detail+14 wk · 12 of 133 slices shown]
[im 5/133]
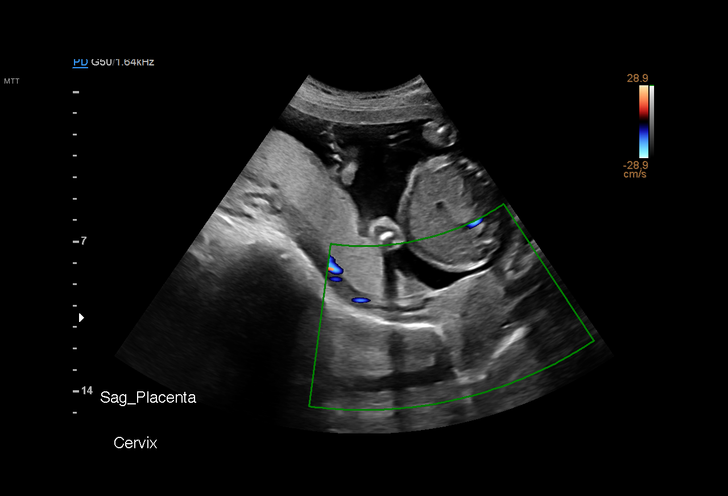
[im 15/133]
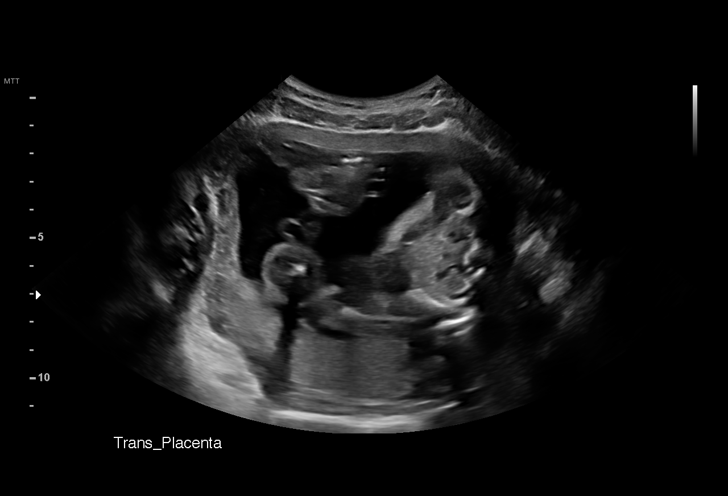
[im 25/133]
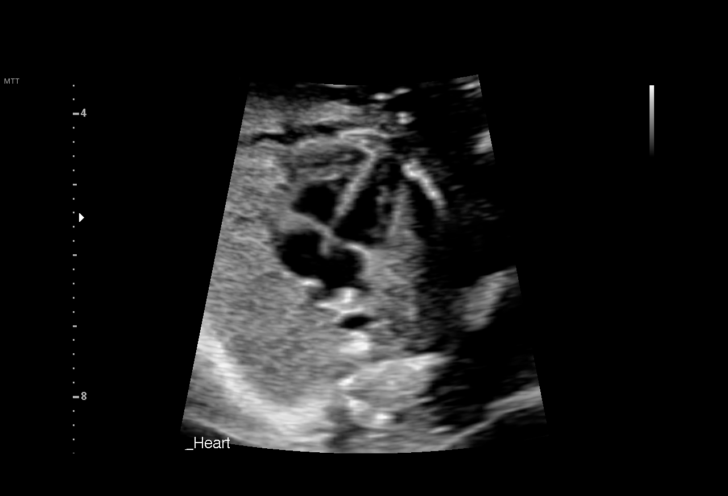
[im 40/133]
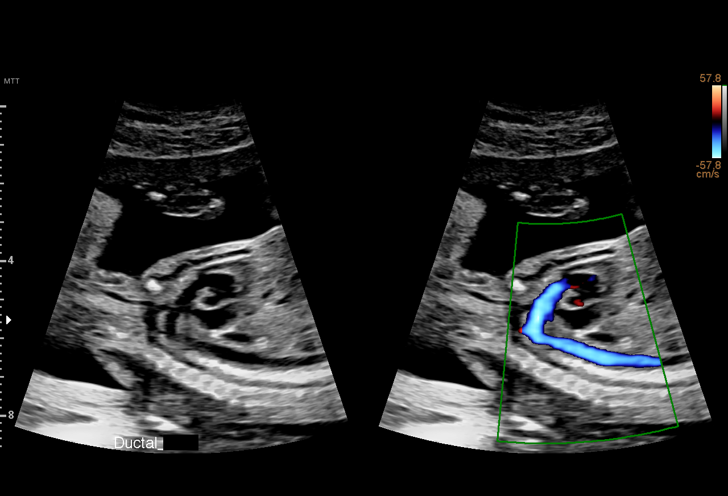
[im 49/133]
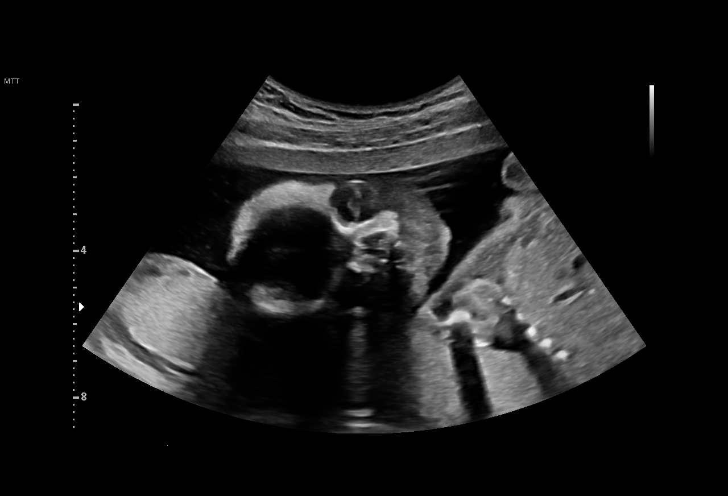
[im 59/133]
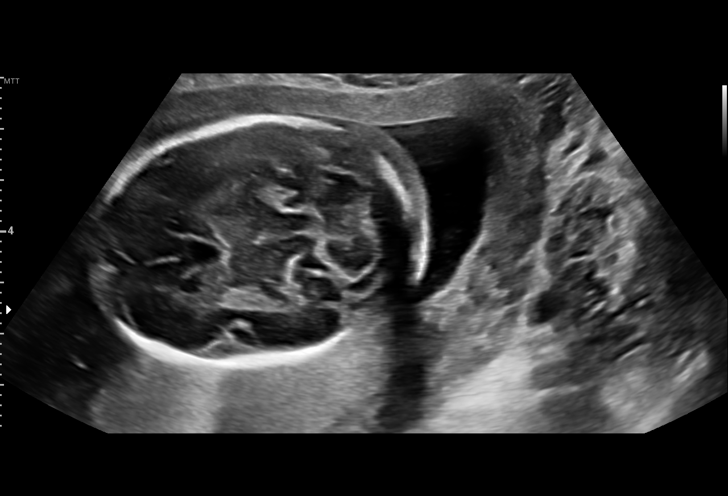
[im 74/133]
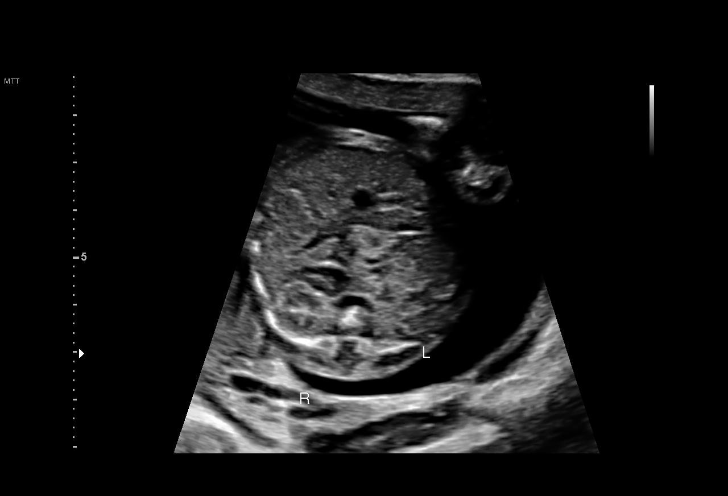
[im 84/133]
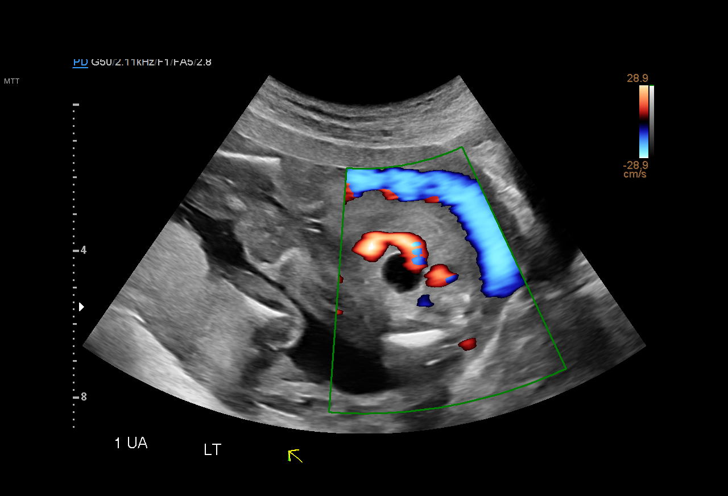
[im 93/133]
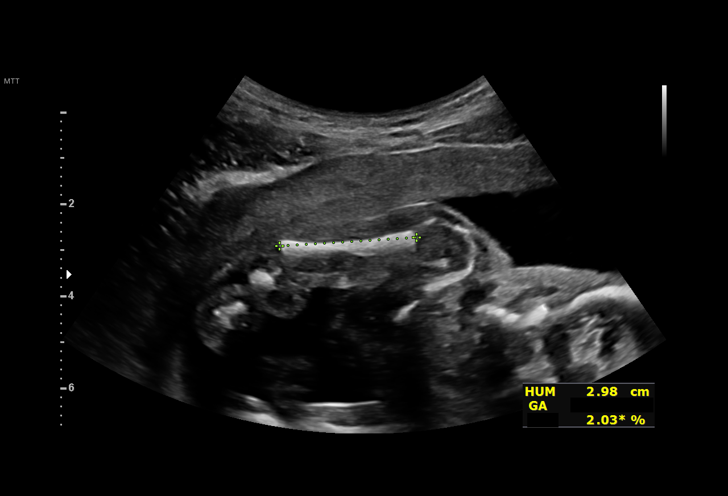
[im 108/133]
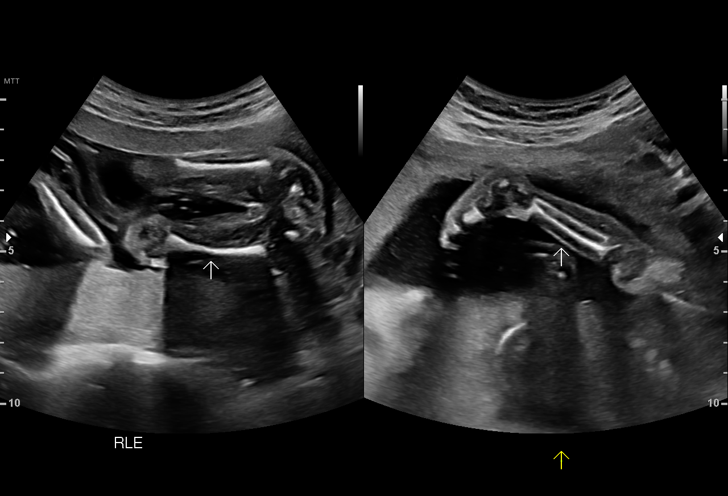
[im 118/133]
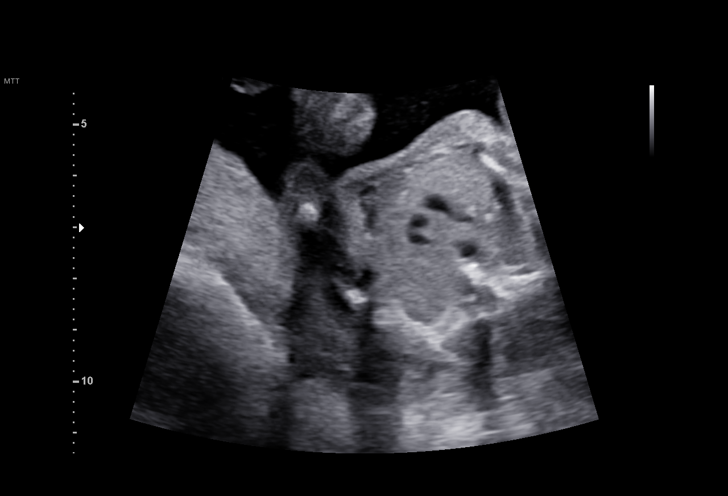
[im 128/133]
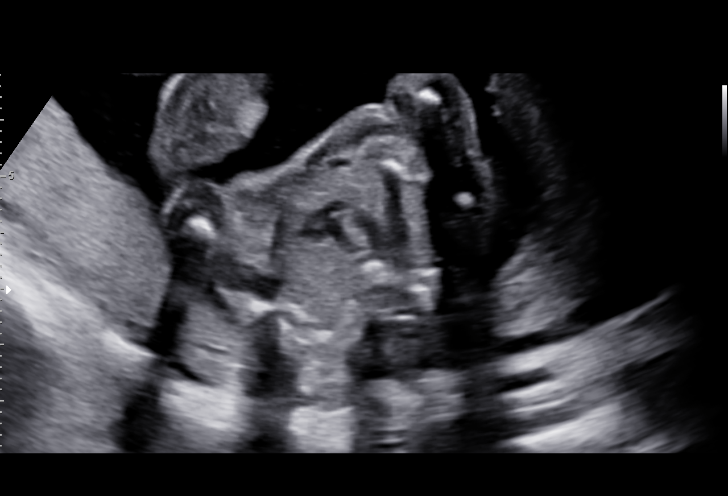

[12 of 28 positions shown; findings below may reference images not displayed]

Indications

 2 vessel umbilical cord
 20 weeks gestation of pregnancy
 Encounter for antenatal screening for
 malformations
 Encounter for uncertain dates
Fetal Evaluation

 Num Of Fetuses:         1
 Fetal Heart Rate(bpm):  136
 Cardiac Activity:       Observed
 Presentation:           Breech
 Placenta:               Posterior
 P. Cord Insertion:      Visualized, central

 Amniotic Fluid
 AFI FV:      Within normal limits

                             Largest Pocket(cm)

Biometry

 BPD:      46.6  mm     G. Age:  20w 1d         24  %    CI:        65.54   %    70 - 86
                                                         FL/HC:      17.6   %    15.9 -
 HC:      184.8  mm     G. Age:  20w 6d         46  %    HC/AC:      1.13        1.06 -
 AC:       164   mm     G. Age:  21w 3d         68  %    FL/BPD:     70.0   %
 FL:       32.6  mm     G. Age:  20w 1d         24  %    FL/AC:      19.9   %    20 - 24
 HUM:        30  mm     G. Age:  19w 6d         26  %
 CER:      22.3  mm     G. Age:  20w 6d         70  %
 NFT:       3.9  mm
 LV:          6  mm
 CM:        6.2  mm

 Est. FW:     381  gm    0 lb 13 oz      52  %
OB History

 Blood Type:   B+
 Gravidity:    3         Term:   1        Prem:   0        SAB:   1
 TOP:          0       Ectopic:  0        Living: 1
Gestational Age

 LMP:           24w 2d        Date:  06/06/20                 EDD:   03/13/21
 U/S Today:     20w 5d                                        EDD:   04/07/21
 Best:          20w 5d     Det. By:  U/S (11/23/20)           EDD:   04/07/21
Anatomy

 Cranium:               Appears normal         Aortic Arch:            Not well visualized
 Cavum:                 Appears normal         Ductal Arch:            Abnormal, see
                                                                       comments
 Ventricles:            Appears normal         Diaphragm:              Appears normal
 Choroid Plexus:        Appears normal         Stomach:                Appears normal, left
                                                                       sided
 Cerebellum:            Appears normal         Abdomen:                Appears normal
 Posterior Fossa:       Appears normal         Abdominal Wall:         Appears nml (cord
                                                                       insert, abd wall)
 Nuchal Fold:           Appears normal         Cord Vessels:           2 vessel cord,
                                                                       absent right [place]
 Face:                  Appears normal         Kidneys:                Appear normal
                        (orbits and profile)
 Lips:                  Appears normal         Bladder:                Appears normal
 Thoracic:              Appears normal         Spine:                  Not well visualized
 Heart:                 Appears normal         Upper Extremities:      Appears normal
                        (4CH, axis, and
                        situs)
 RVOT:                  Appears normal         Lower Extremities:      Appears normal
 LVOT:                  Appears normal

 Other:  Fetus appears to be female. Heels/feet and open hands/ Left 5th
         digits visualized.
Cervix Uterus Adnexa

 Cervix
 Length:           4.08  cm.
 Normal appearance by transabdominal scan.

 Uterus
 No abnormality visualized.

 Right Ovary
 Within normal limits.

 Left Ovary
 Within normal limits.
 Cul De Sac
 No free fluid seen.

 Adnexa
 No abnormality visualized.
Comments

 This patient was seen for a detailed fetal anatomy scan.  The
 patient is uncertain of her due date.  Her first trimester
 ultrasound performed in July 2020 was too early to show
 a fetus in the gestational sac.  She has not returned for
 another ultrasound until today.
 She denies any significant past medical history and denies
 any problems in her current pregnancy.
 She had a cell free DNA test earlier in her pregnancy which
 indicated a low risk for trisomy 21, 18, and 13. A female fetus
 is predicted.
 Based on the fetal biometry measurements obtained today,
 her EDC should be April 07, 2021 making her 20 weeks and 5
 days pregnant today.
 A two-vessel umbilical cord was noted on today's exam.
 The implications and management of a two-vessel umbilical
 cord was discussed in detail with the patient today.  She was
 advised regarding the small association of trisomy 18 with a
 two-vessel cord. The patient was reassured that based on her
 negative cell free DNA test, it is highly unlikely that her baby
 will have trisomy 18.
 Due to the small possibility of trisomy 18, the patient was
 offered and declined an amniocentesis today for definitive
 diagnosis of fetal aneuploidy. The small risk of fetal growth
 issues later in her pregnancy due to the two-vessel cord was
 also discussed today.  She was also advised that a two-
 vessel umbilical cord may be associated with an increased
 risk of congenital heart defects.
 A possible abnormal three-vessel view of the fetal heart was
 visualized today.  The four-chamber view and the proximal
 right and left outflow tracts of the fetal heart appeared within
 normal limits.  However, there appears to be a separation
 (possibly by the trachea and esophagus) of the pulmonary
 artery and aorta on the three-vessel view.  The pulmonary
 artery courses around and connects with the aorta forming a
 ringlike structure.  This may represent a vascular ring.
 Due to the association of a congenital heart defect with a two-
 vessel umbilical cord, she was referred to [HOSPITAL] pediatric
 cardiology for a fetal echocardiogram.  The pediatric
 cardiologist will assess the three-vessel view to determine if
 any abnormalities are present.
 The patient was informed that anomalies may be missed due
 to technical limitations. If the fetus is in a suboptimal position
 or maternal habitus is increased, visualization of the fetus in
 the maternal uterus may be impaired.
 Due to the association of fetal growth restriction in fetuses
 with a two-vessel cord, we will continue to follow her with
 growth ultrasounds throughout her pregnancy.
 A follow-up exam was scheduled in 4 weeks.

## 2022-10-25 IMAGING — US US MFM OB FOLLOW-UP
1 series · 13 of 28 positions shown · non-contrast
Comparison: none

[Series 1: us mfm ob follow-up · 45 acquisitions, 13 frames shown]
[im 2/45]
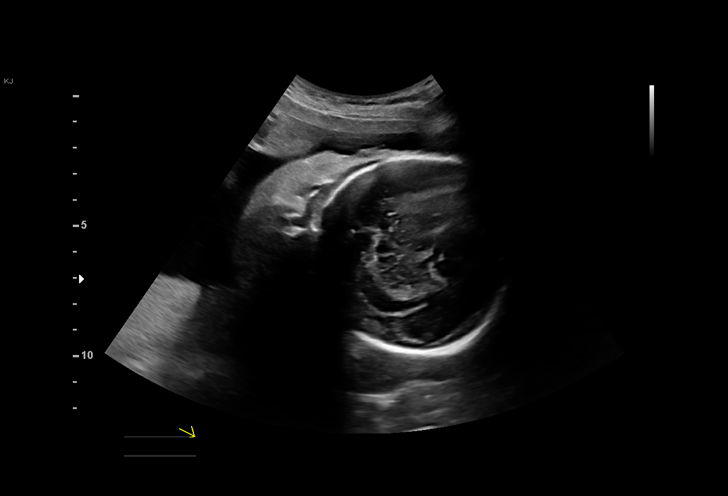
[im 5/45]
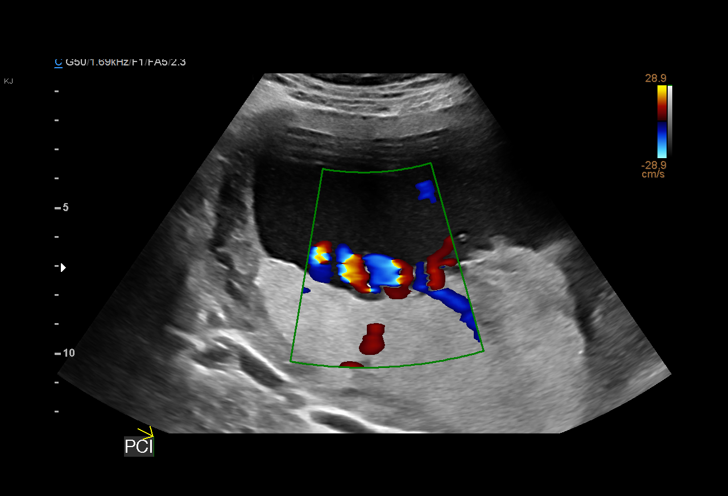
[im 9/45]
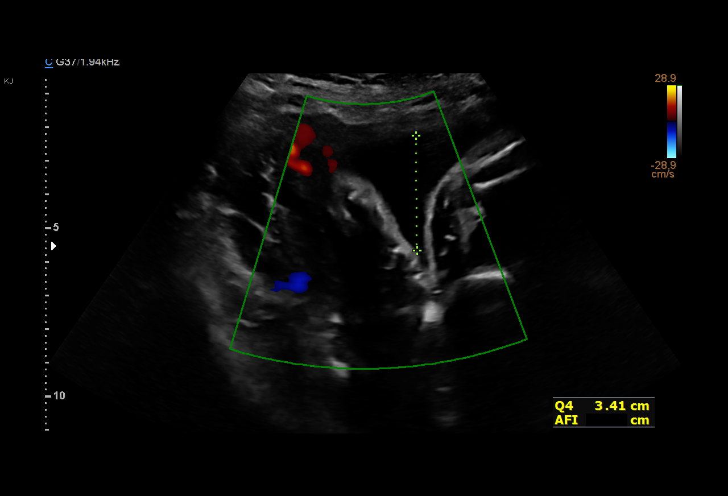
[im 12/45]
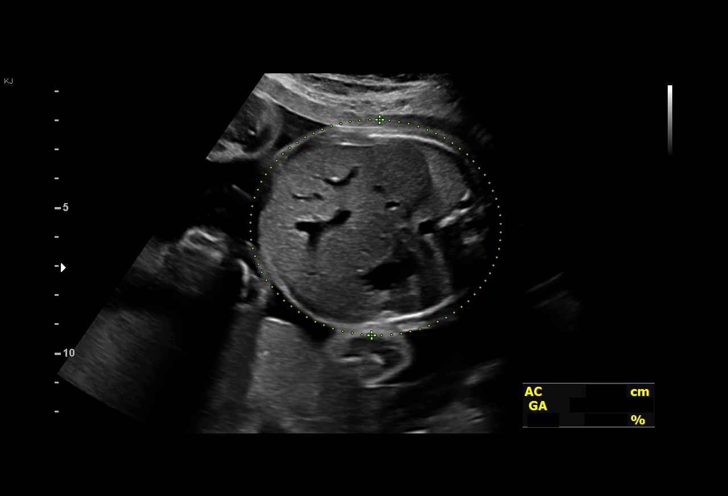
[im 15/45]
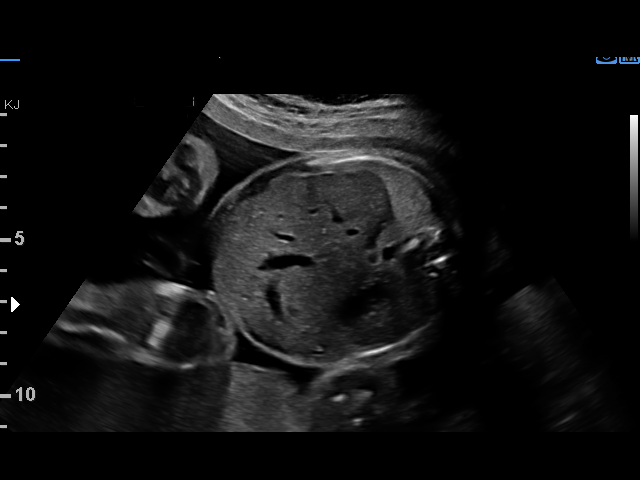
[im 18/45]
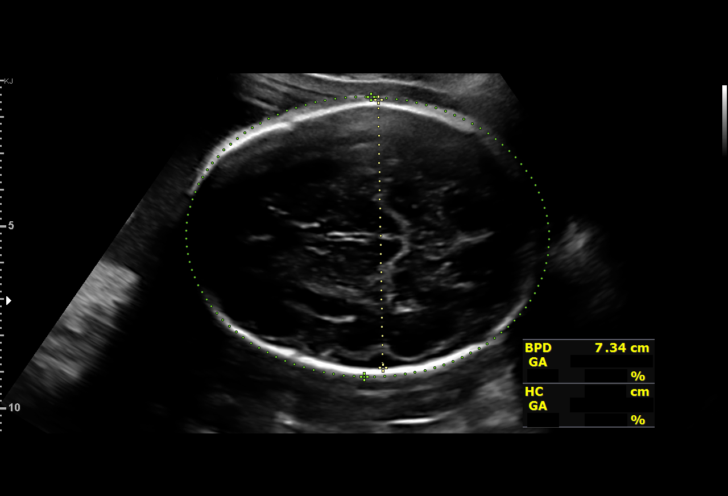
[im 23/45]
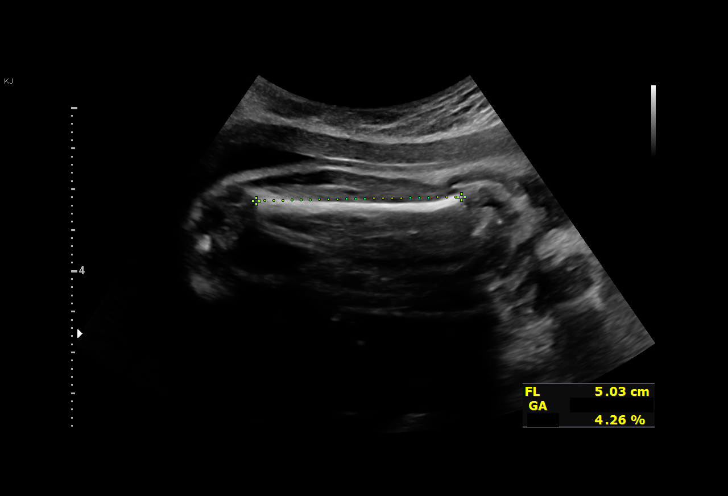
[im 27/45]
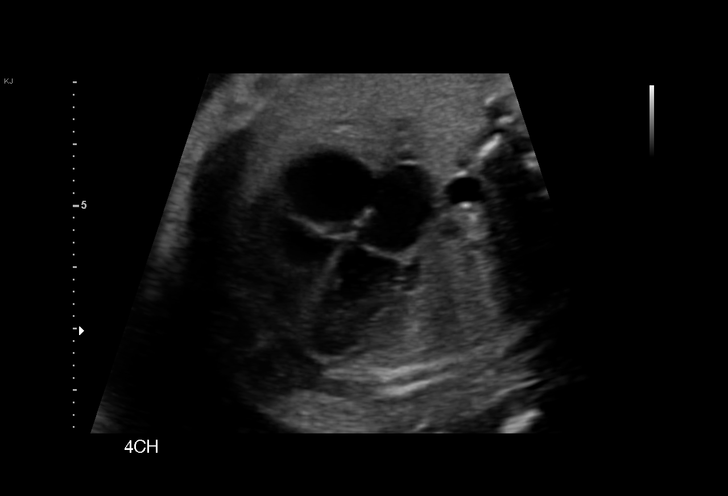
[im 30/45]
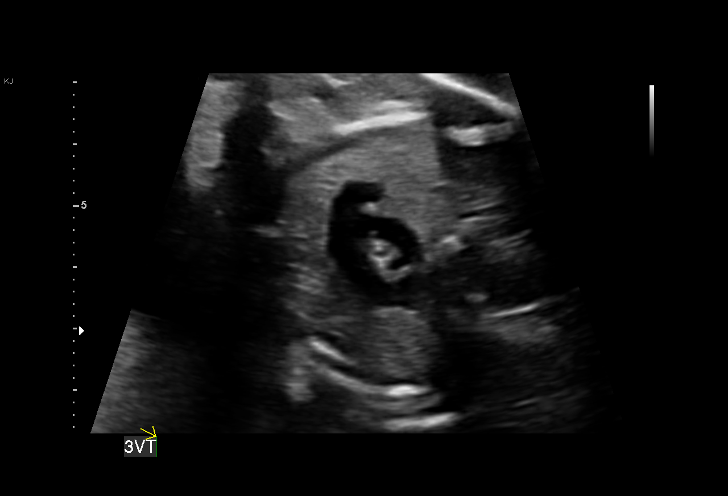
[im 33/45]
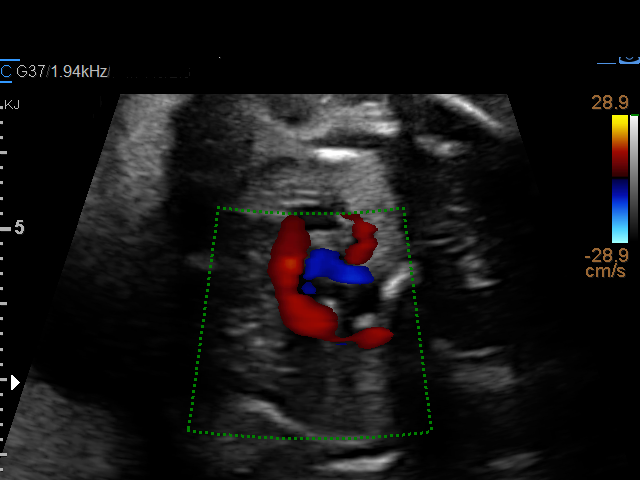
[im 36/45]
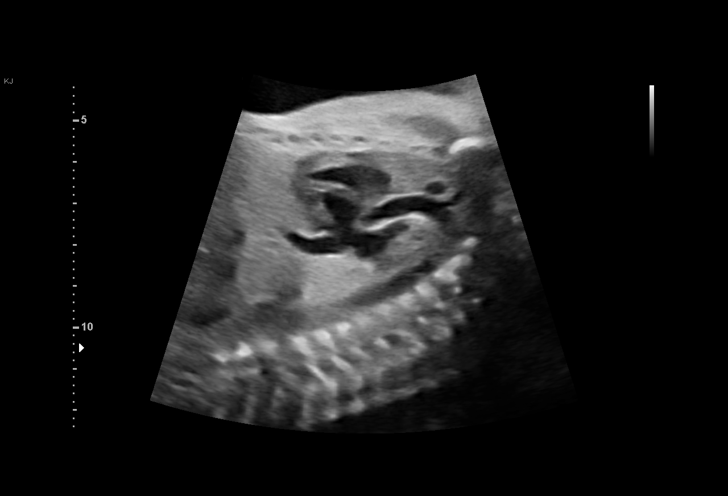
[im 40/45]
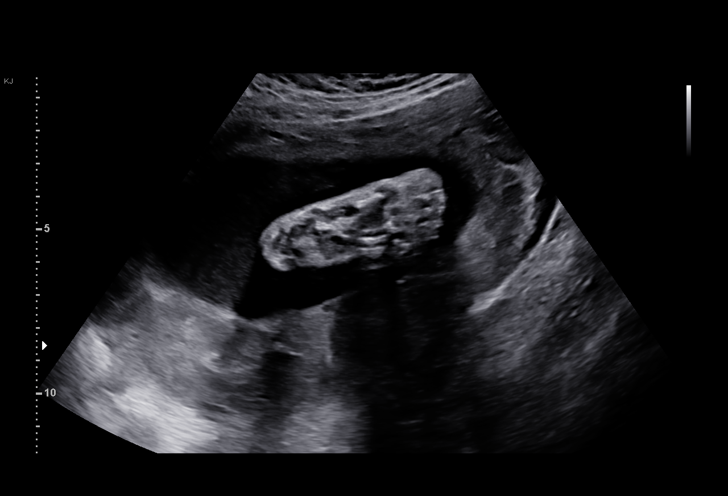
[im 43/45]
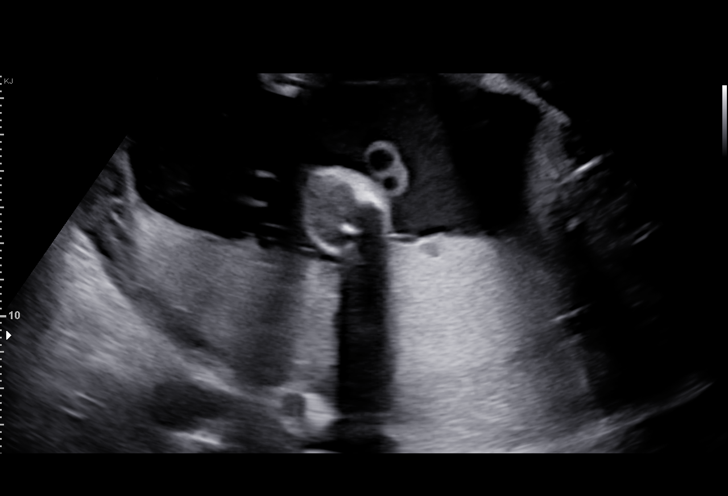

[13 of 28 positions shown; findings below may reference images not displayed]

Indications

 2 vessel umbilical cord
 Fetal abnormality - other known or
 suspected (right AA, confirmed by fetal echo)
 Encounter for other antenatal screening
 follow-up
 Low risk NIPS
 28 weeks gestation of pregnancy
Fetal Evaluation

 Num Of Fetuses:         1
 Fetal Heart Rate(bpm):  140
 Cardiac Activity:       Observed
 Presentation:           Cephalic
 Placenta:               Posterior
 P. Cord Insertion:      Visualized, central

 Amniotic Fluid
 AFI FV:      Within normal limits

 AFI Sum(cm)     %Tile       Largest Pocket(cm)
 17.7            67

 RUQ(cm)       RLQ(cm)       LUQ(cm)        LLQ(cm)
 5.8           3.4           4
Biometry

 BPD:        73  mm     G. Age:  29w 2d         57  %    CI:        70.23   %    70 - 86
                                                         FL/HC:      18.5   %    19.6 -
 HC:      277.8  mm     G. Age:  30w 3d         69  %    HC/AC:      1.10        0.99 -
 AC:      251.7  mm     G. Age:  29w 3d         64  %    FL/BPD:     70.5   %    71 - 87
 FL:       51.5  mm     G. Age:  27w 4d          9  %    FL/AC:      20.5   %    20 - 24

 LV:          2  mm
 Est. FW:    9366  gm    2 lb 14 oz      42  %
OB History

 Blood Type:   B+
 Gravidity:    3         Term:   1        Prem:   0        SAB:   1
 TOP:          0       Ectopic:  0        Living: 1
Gestational Age

 LMP:           32w 2d        Date:  06/06/20                 EDD:   03/13/21
 U/S Today:     29w 1d                                        EDD:   04/04/21
 Best:          28w 5d     Det. By:  U/S  (11/23/20)          EDD:   04/07/21
Anatomy

 Cranium:               Appears normal         LVOT:                   Previously seen
 Cavum:                 Appears normal         Aortic Arch:            Right sided aortic
                                                                       arch
 Ventricles:            Appears normal         Ductal Arch:            Previously seen
 Choroid Plexus:        Previously seen        Diaphragm:              Appears normal
 Cerebellum:            Previously seen        Stomach:                Appears normal, left
                                                                       sided
 Posterior Fossa:       Previously seen        Abdomen:                Previously seen
 Nuchal Fold:           Previously seen        Abdominal Wall:         Previously seen
 Face:                  Profile nl; orbits     Cord Vessels:           Known 2 Vessel
                        prev visualized
                                                                       Cord
 Lips:                  Previously seen        Kidneys:                Appear normal
 Palate:                Previously seen        Bladder:                Appears normal
 Thoracic:              Previously seen        Spine:                  Previously seen
 Heart:                 Appears normal         Upper Extremities:      Previously seen
                        (4CH, axis, and
                        situs)
 RVOT:                  Previously seen        Lower Extremities:      Previously seen

 Other:  Female gender previously seen. Heels/feet and open hands/5th digits
         previously visualized. Lenses previously visualized. 3VV, VC
         visualized.
Comments

 This patient was seen for a follow up growth scan due to a
 fetus with a two-vessel cord.  A right-sided aortic arch has
 been noted on her fetal echocardiogram.  The patient reports
 that the pediatric cardiologist has recommended that her
 baby be evaluated after birth to determine if any treatment is
 necessary.  She denies any problems since her last exam.
 She was informed that the fetal growth and amniotic fluid
 level appears appropriate for her gestational age.
 A right-sided aortic arch continues to be noted today.  The
 fetal trachea and esophagus appears to be located in
 between two large blood vessels, the pulmonary artery and
 the right-sided aortic arch or ductus arteriosus.  The patient
 was advised that these two blood vessels may form a
 vascular ring which may cause compression of the trachea
 and esophagus causing symptoms of choking, noisy
 breathing, and difficulty swallowing after birth.
 As the symptoms associated with a vascular ring are variable
 and may not develop for some time after birth, the pediatric
 cardiologist has stated that the patient may be able to deliver
 at the [REDACTED].  If the baby were to
 develop symptoms after birth, a cardiac CT angiogram can
 be performed to assess the head and neck vessels and to
 determine if a surgical release of the ring is necessary.
 The patient's case will be presented at a future [facility]
 meeting.
 A follow up exam was scheduled in 4 weeks.

## 2024-02-08 ENCOUNTER — Inpatient Hospital Stay (HOSPITAL_COMMUNITY): Payer: Self-pay

## 2024-02-08 ENCOUNTER — Encounter (HOSPITAL_COMMUNITY): Payer: Self-pay | Admitting: Obstetrics & Gynecology

## 2024-02-08 ENCOUNTER — Inpatient Hospital Stay (HOSPITAL_COMMUNITY)
Admission: AD | Admit: 2024-02-08 | Discharge: 2024-02-08 | Disposition: A | Discharge status: Home / Self Care | Payer: Self-pay | Attending: Obstetrics & Gynecology | Admitting: Obstetrics & Gynecology

## 2024-02-08 DIAGNOSIS — R109 Unspecified abdominal pain: Secondary | ICD-10-CM | POA: Diagnosis present

## 2024-02-08 DIAGNOSIS — Z3A01 Less than 8 weeks gestation of pregnancy: Secondary | ICD-10-CM | POA: Diagnosis not present

## 2024-02-08 DIAGNOSIS — O208 Other hemorrhage in early pregnancy: Secondary | ICD-10-CM | POA: Insufficient documentation

## 2024-02-08 DIAGNOSIS — O209 Hemorrhage in early pregnancy, unspecified: Secondary | ICD-10-CM

## 2024-02-08 HISTORY — DX: Other specified health status: Z78.9

## 2024-02-08 LAB — URINALYSIS, ROUTINE W REFLEX MICROSCOPIC
Bilirubin Urine: NEGATIVE
Glucose, UA: NEGATIVE mg/dL
Hgb urine dipstick: NEGATIVE
Ketones, ur: 20 mg/dL — AB
Leukocytes,Ua: NEGATIVE
Nitrite: NEGATIVE
Protein, ur: 30 mg/dL — AB
Specific Gravity, Urine: 1.026 (ref 1.005–1.030)
pH: 5 (ref 5.0–8.0)

## 2024-02-08 LAB — WET PREP, GENITAL
Clue Cells Wet Prep HPF POC: NONE SEEN
Sperm: NONE SEEN
Trich, Wet Prep: NONE SEEN
WBC, Wet Prep HPF POC: <10 (ref <10)
Yeast Wet Prep HPF POC: NONE SEEN

## 2024-02-08 LAB — CBC
HCT: 40.1 % (ref 36.0–46.0)
Hemoglobin: 13.8 g/dL (ref 12.0–15.0)
MCH: 31.7 pg (ref 26.0–34.0)
MCHC: 34.4 g/dL (ref 30.0–36.0)
MCV: 92 fL (ref 80.0–100.0)
Platelets: 233 10*3/uL (ref 150–400)
RBC: 4.36 MIL/uL (ref 3.87–5.11)
RDW: 12.1 % (ref 11.5–15.5)
WBC: 7 10*3/uL (ref 4.0–10.5)
nRBC: 0 % (ref 0.0–0.2)

## 2024-02-08 LAB — HCG, QUANTITATIVE, PREGNANCY: hCG, Beta Chain, Quant, S: 6868 m[IU]/mL — ABNORMAL HIGH (ref <5)

## 2024-02-08 LAB — ABO/RH: ABO/RH(D): B POS

## 2024-02-08 LAB — POCT PREGNANCY, URINE: Preg Test, Ur: POSITIVE — AB

## 2024-02-08 NOTE — MAU Note (Signed)
 Samantha Liu is a 33 y.o. at Unknown here in MAU reporting: last night she was having some bleeding, was small amt- was spotting. Seemed like a lot when she was in the shower, then it stopped.  None today, though she is cramping some this morning.  LMP: 12/30/2023 Not confirmed anywhere, +HPT on Monday Onset of complaint: last night Pain score: mild Vitals:   02/08/24 1112  BP: 123/70  Pulse: 93  Resp: 15  Temp: 99 F (37.2 C)  SpO2: 100%      Lab orders placed from triage:  UPT, UA, vag swabs collected

## 2024-02-08 NOTE — MAU Provider Note (Signed)
 Chief Complaint:  Abdominal Pain   HPI   None     Samantha Liu is a 33 y.o. [id number] at Unknown presenting to maternity admissions reporting vaginal bleeding. She endorses light bleeding last night, described as spotting for a few hours. She states that it seemed like a lot while in the shower, bleeding stopped after the shower last night. She is not experiencing vaginal bleeding today, but she does have some abdominal cramping. Reports positive home pregnancy test on 02/05/2024.  Positive history for history of prior miscarriage, negative for tubal ligation or IUD. They have not had clinical UPT or IUP confirmed with ultrasound.  Pregnancy Course: Positive HPT, LMP 12/30/2023. Patient has previously received care at [facility], last office visit was 04/12/2021.   Past Medical History:  Diagnosis Date   Medical history non-contributory    Preterm labor 2022   PPROM   OB History  Gravida Para Term Preterm AB Living  4 2 1 1 1 2   SAB IAB Ectopic Multiple Live Births  1   0 2    # Outcome Date GA Lbr Len/2nd Weight Sex Type Anes PTL Lv  4 Current           3 Preterm 03/03/21 [redacted]w[redacted]d    Vag-Spont   LIV     Birth Comments: PPROM at 32 weeks  2 SAB 12/2019          1 Term 08/10/16 [redacted]w[redacted]d 11:03 / 01:09 2950 g F Vag-Vacuum EPI  LIV   Past Surgical History:  Procedure Laterality Date   NO PAST SURGERIES     Family History  Problem Relation Age of Onset   Healthy Mother    Diabetes Father    Heart disease Father    Kidney failure Father        waiting on transplant, diabetes related   Diabetes Paternal Aunt    Diabetes Paternal Uncle    Social History   Tobacco Use   Smoking status: Never   Smokeless tobacco: Never  Vaping Use   Vaping status: Never Used  Substance Use Topics   Alcohol use: No    Alcohol/week: 0.0 standard drinks of alcohol   Drug use: No   No Known Allergies No medications prior to admission.    I have reviewed patient's Past Medical Hx, Surgical Hx,  Family Hx, Social Hx, medications and allergies.   ROS  Pertinent items noted in HPI and remainder of comprehensive ROS otherwise negative.   PHYSICAL EXAM  Patient Vitals for the past 24 hrs:  BP Temp Temp src Pulse Resp SpO2 Height Weight  02/08/24 1112 123/70 99 F (37.2 C) Oral 93 15 100 % 5\' 5"  (1.651 m) 55.3 kg     Constitutional: Well-developed, well-nourished female in no acute distress.  Cardiovascular: warm and well-perfused Respiratory: normal effort, no problems with respiration noted MSK: Extremities nontender, no edema, normal ROM Skin: warm and dry. Acyanotic, no jaundice or pallor. Neurologic: Alert and oriented x 4. No abnormal coordination. Psychiatric: Normal mood. Speech not slurred, not rapid/pressured. Patient is cooperative.  Labs: Results for orders placed or performed during the hospital encounter of 02/08/24 (from the past 24 hours)  Pregnancy, urine POC     Status: Abnormal   Collection Time: 02/08/24 11:34 AM  Result Value Ref Range   Preg Test, Ur POSITIVE (A) NEGATIVE  Urinalysis, Routine w reflex microscopic -Urine, Clean Catch     Status: Abnormal   Collection Time: 02/08/24 11:37 AM  Result  Value Ref Range   Color, Urine YELLOW YELLOW   APPearance HAZY (A) CLEAR   Specific Gravity, Urine 1.026 1.005 - 1.030   pH 5.0 5.0 - 8.0   Glucose, UA NEGATIVE NEGATIVE mg/dL   Hgb urine dipstick NEGATIVE NEGATIVE   Bilirubin Urine NEGATIVE NEGATIVE   Ketones, ur 20 (A) NEGATIVE mg/dL   Protein, ur 30 (A) NEGATIVE mg/dL   Nitrite NEGATIVE NEGATIVE   Leukocytes,Ua NEGATIVE NEGATIVE   RBC / HPF 0-5 0 - 5 RBC/hpf   WBC, UA 0-5 0 - 5 WBC/hpf   Bacteria, UA RARE (A) NONE SEEN   Squamous Epithelial / HPF 0-5 0 - 5 /HPF   Mucus PRESENT   Wet prep, genital     Status: None   Collection Time: 02/08/24 11:37 AM  Result Value Ref Range   Yeast Wet Prep HPF POC NONE SEEN NONE SEEN   Trich, Wet Prep NONE SEEN NONE SEEN   Clue Cells Wet Prep HPF POC NONE SEEN  NONE SEEN   WBC, Wet Prep HPF POC <10 <10   Sperm NONE SEEN   ABO/Rh     Status: None   Collection Time: 02/08/24 12:06 PM  Result Value Ref Range   ABO/RH(D) B POS    No rh immune globuloin      NOT A RH IMMUNE GLOBULIN CANDIDATE, PT RH POSITIVE Performed at [facility], [address] [place], [place] [address]   CBC     Status: None   Collection Time: 02/08/24 12:08 PM  Result Value Ref Range   WBC 7.0 4.0 - 10.5 K/uL   RBC 4.36 3.87 - 5.11 MIL/uL   Hemoglobin 13.8 12.0 - 15.0 g/dL   HCT 60.4 54.0 - 98.1 %   MCV 92.0 80.0 - 100.0 fL   MCH 31.7 26.0 - 34.0 pg   MCHC 34.4 30.0 - 36.0 g/dL   RDW 19.1 47.8 - 29.5 %   Platelets 233 150 - 400 K/uL   nRBC 0.0 0.0 - 0.2 %  hCG, quantitative, pregnancy     Status: Abnormal   Collection Time: 02/08/24 12:08 PM  Result Value Ref Range   hCG, Beta Chain, Quant, S 6,868 (H) <5 mIU/mL    Imaging:  US OB LESS THAN 14 WEEKS WITH OB TRANSVAGINAL Result Date: 02/08/2024 CLINICAL DATA:  [id number] Abdominal pain during pregnancy in first trimester [id number] EXAM: OBSTETRIC <14 WK [place] AND TRANSVAGINAL OB US TECHNIQUE: Both transabdominal and transvaginal ultrasound examinations were performed for complete evaluation of the gestation as well as the maternal uterus, adnexal regions, and pelvic cul-de-sac. Transvaginal technique was performed to assess early pregnancy. COMPARISON:  None for this pregnancy FINDINGS: Intrauterine gestational sac: Present Yolk sac:  Present Embryo:  Not present, at this time Cardiac Activity: Not present, at this time MSD: 7 mm   5 w   3 d Subchorionic hemorrhage: Large subchorionic hematoma, measuring 2.8 cm in length Maternal uterus/adnexae: Nonenlarged right ovary. The left ovary measures 3.7 x 2.7 x 2.8 cm, containing the corpus luteum, which measures 2.3 cm. No free pelvic fluid. IMPRESSION: 1. Early single intrauterine gestation with a yolk sac present and estimated gestational age of [redacted] weeks, 3 days. No fetal pole  or fetal heartbeat visualized at this time, likely due to the earliness of the gestation. Close obstetric follow-up with a repeat first trimester pregnancy ultrasound in 14 days is recommended to assess for progression of the pregnancy. 2. Large subchronic hematoma, measuring 2.8 cm  in length. Electronically Signed   By: Wallie Char M.D.   On: 02/08/2024 15:04     MDM & MAU COURSE  MDM: Moderate  MAU Course: -Positive UPT, starting ectopic workup. -Wet prep negative. UA shows signs of dehydration. -CBC within normal limits, hemoglobin 13.8. -US shows IUP with gestational sac and yolk sac. -GA [redacted]w[redacted]d congruent with estimated GA based on hCG and [place]. -Subchorionic hemorrhage 2.8 cm long on [place]. Recommendation to follow up with [place] in 2 weeks.  Differential diagnosis considered for 1st trimester vaginal bleeding includes but is not limited to: ectopic pregnancy, complete spontaneous abortion, incomplete abortion, missed abortion, threatened abortion, embryonic/fetal demise, cervical insufficiency, cervical or vaginal disorder   Orders Placed This Encounter  Procedures   Wet prep, genital   US OB LESS THAN 14 WEEKS WITH OB TRANSVAGINAL   Urinalysis, Routine w reflex microscopic -Urine, Clean Catch   CBC   hCG, quantitative, pregnancy   Pregnancy, urine POC   ABO/Rh   Discharge patient   No orders of the defined types were placed in this encounter.   ASSESSMENT   1. Vaginal bleeding in pregnancy, first trimester   2. [redacted] weeks gestation of pregnancy     PLAN  Discharge home in stable condition with ectopic return precautions.  Follow up with [place] in 2 weeks for IUP. Sent message to [place] office admin to schedule.   Follow-up Information     [facility] Follow up in 2 week(s).   Specialty: Obstetrics and Gynecology Why: Follow up ultrasound Contact information: [address], [address] [place]  [address] [phone]                 Allergies as of 02/08/2024   No Known Allergies      Medication List     STOP taking these medications    Blood Pressure Kit Devi   Blood Pressure Kit Kit   PrePLUS 27-1 MG Tabs   Xulane 150-35 MCG/24HR transdermal patch Generic drug: norelgestromin-ethinyl estradiol       TAKE these medications    acetaminophen 500 MG tablet Commonly known as: TYLENOL Take 1,000 mg by mouth every 6 (six) hours as needed for moderate pain.   Prenate Pixie 10-0.6-0.4-200 MG Caps Take by mouth.        Melida Quitter, PA

## 2024-02-09 LAB — GC/CHLAMYDIA PROBE AMP (~~LOC~~) NOT AT ARMC
Chlamydia: NEGATIVE
Comment: NEGATIVE
Comment: NORMAL
Neisseria Gonorrhea: NEGATIVE

## 2024-03-01 ENCOUNTER — Ambulatory Visit: Payer: Self-pay | Admitting: *Deleted

## 2024-03-01 ENCOUNTER — Other Ambulatory Visit: Payer: Self-pay

## 2024-03-01 VITALS — BP 110/74 | HR 98 | Wt 125.3 lb

## 2024-03-01 DIAGNOSIS — Z3A08 8 weeks gestation of pregnancy: Secondary | ICD-10-CM

## 2024-03-01 DIAGNOSIS — O3680X0 Pregnancy with inconclusive fetal viability, not applicable or unspecified: Secondary | ICD-10-CM

## 2024-03-01 DIAGNOSIS — O0990 Supervision of high risk pregnancy, unspecified, unspecified trimester: Secondary | ICD-10-CM

## 2024-03-01 DIAGNOSIS — O0991 Supervision of high risk pregnancy, unspecified, first trimester: Secondary | ICD-10-CM

## 2024-03-01 MED ORDER — PRENATE PIXIE 10-0.6-0.4-200 MG PO CAPS: 1.0000 | ORAL_CAPSULE | Freq: Every day | ORAL | 11 refills | Status: AC

## 2024-03-01 NOTE — Progress Notes (Signed)
 New OB Intake  I connected with Samantha Liu  on 03/01/24 at 10:15 AM EDT by In Person Visit and verified that I am speaking with the correct person using two identifiers. Nurse is located at [facility] and pt is located at [place].  I discussed the limitations, risks, security and privacy concerns of performing an evaluation and management service by telephone and the availability of in person appointments. I also discussed with the patient that there may be a patient responsible charge related to this service. The patient expressed understanding and agreed to proceed.  I explained I am completing New OB Intake today. We discussed EDD of 10/05/2024, by Last Menstrual Period. Pt is [id number]. I reviewed her allergies, medications and Medical/Surgical/OB history.    Patient Active Problem List   Diagnosis Date Noted   Supervision of high risk pregnancy, antepartum 03/01/2024   Contraceptive patch status 04/12/2021   History of preterm premature rupture of membranes (PPROM) 04/12/2021     Concerns addressed today  Delivery Plans Plans to deliver at [facility] Discussed the nature of our practice with multiple providers including residents and students. Due to the size of the practice, the delivering provider may not be the same as those providing prenatal care.   Patient is interested in water birth.  MyChart/Babyscripts MyChart access verified. I explained pt will have some visits in office and some virtually. Babyscripts instructions given and order placed. Patient verifies receipt of registration text/e-mail. Account successfully created and app downloaded. If patient is a candidate for Optimized scheduling, add to sticky note.   Blood Pressure Cuff/Weight Scale Pt has BP cuff at home. Explained after first prenatal appt pt will check weekly and document in Babyscripts. Patient does not have weight scale; patient may purchase if they desire to track weight weekly in Babyscripts.  Anatomy  US  Explained first scheduled US  will be around 19 weeks. Anatomy US  scheduled for TBD at TBD.   Is patient a candidate for Babyscripts Optimization? No, due to Risk Factors   First visit review I reviewed new OB appt with patient. Explained pt will be seen by Kirstie Percy, PA at first visit. Discussed [place] genetic screening with patient. Requested Panorama and Horizon.. Routine prenatal labs  OB Urine only collected. Initial OB labs deferred to New OB.    Last Pap Diagnosis  Date Value Ref Range Status  06/05/2018 (A)  Final   -  LOW GRADE SQUAMOUS INTRAEPITHELIAL LESION: CIN-1/ HPV (LSIL)    Donette Furlong, RN 03/01/2024  10:47 AM

## 2024-03-03 LAB — CULTURE, OB URINE

## 2024-03-03 LAB — URINE CULTURE, OB REFLEX: Organism ID, Bacteria: NO GROWTH

## 2024-03-11 ENCOUNTER — Encounter: Payer: Self-pay | Admitting: Obstetrics & Gynecology

## 2024-03-29 ENCOUNTER — Encounter: Payer: Self-pay | Admitting: Physician Assistant

## 2024-03-29 NOTE — Progress Notes (Deleted)
   PRENATAL VISIT NOTE  Subjective:  Samantha Liu is a 33 y.o. [phone] at [redacted]w[redacted]d being seen today for her first prenatal visit for this pregnancy.  She is currently monitored for the following issues for this {Blank single:19197::"high-risk","low-risk"} pregnancy and has Contraceptive patch status; History of preterm premature rupture of membranes (PPROM); and Supervision of high risk pregnancy, antepartum on their problem list.  Patient reports {sx:14538}.   .  .   . Denies leaking of fluid.   She is planning to {Blank single:19197::"breastfeed","bottle feed"}. Desires *** for contraception.   The following portions of the patient's history were reviewed and updated as appropriate: allergies, current medications, past family history, past medical history, past social history, past surgical history and problem list.   Objective:  There were no vitals filed for this visit.  Fetal Status:           General:  Alert, oriented and cooperative. Patient is in no acute distress.  Skin: Skin is warm and dry. No rash noted.   Cardiovascular: Normal heart rate and rhythm noted  Respiratory: Normal respiratory effort, no problems with respiration noted. Clear to auscultation.   Abdomen: Soft, gravid, appropriate for gestational age. Normal bowel sounds. Non-tender.       Pelvic: {Blank single:19197::"Cervical exam performed","Cervical exam deferred"}       Normal cervical contour, no lesions, no bleeding following pap, normal discharge  Extremities: Normal range of motion.     Mental Status: Normal mood and affect. Normal behavior. Normal judgment and thought content.    Indications for ASA therapy (per uptodate) One of the following: Previous pregnancy with preeclampsia, especially early onset and with an adverse outcome {yes/no:20286} Multifetal gestation {yes/no:20286} Chronic hypertension {yes/no:20286} Type 1 or 2 diabetes mellitus {yes/no:20286} Chronic kidney disease  {yes/no:20286} Autoimmune disease (antiphospholipid syndrome, systemic lupus erythematosus) {yes/no:20286}  Two or more of the following: Nulliparity No Obesity (body mass index >30 kg/m2) {yes/no:20286} Family history of preeclampsia in mother or sister {yes/no:20286} Age >=35 years {yes/no:20286} Sociodemographic characteristics (African American race, low socioeconomic level) Yes Personal risk factors (eg, previous pregnancy with low birth weight or small for gestational age infant, previous adverse pregnancy outcome [eg, stillbirth], interval >10 years between pregnancies) {yes/no:20286}  Assessment and Plan:  Pregnancy: [id number] at [redacted]w[redacted]d  1. Supervision of high risk pregnancy, antepartum (Primary) Initial labs drawn. Continue prenatal vitamins. Genetic Screening discussed: NIPS, carrier screening and AFP *** Ultrasound discussed; fetal anatomic survey: *** Problem list reviewed and updated. Reviewed Brx optimized schedule, patient agreeable The nature of [facility] with multiple MDs and other Advanced Practice Providers was explained to patient; also emphasized that residents, students are part of our team. Routine obstetric precautions reviewed.   2. [redacted] weeks gestation of pregnancy Anticipatory guidance about next visits/weeks of pregnancy given.   Preterm labor/first trimester warning symptoms and general obstetric precautions including but not limited to vaginal bleeding, contractions, leaking of fluid and fetal movement were reviewed in detail with the patient. Please refer to After Visit Summary for other counseling recommendations.    No follow-ups on file.  Future Appointments  Date Time Provider Department Center  03/29/2024  9:15 AM Crespin Forstrom E, PA-C [facility] None    Raisa Ditto E Lonnel Gjerde, PA-C
